# Patient Record
Sex: Female | Born: 1985 | Race: White | Hispanic: No | Marital: Single | Smoking: Never smoker
Health system: Southern US, Community
[De-identification: ages and names within clinical notes are randomized; demographics above are authoritative.]

## PROBLEM LIST (undated history)

## (undated) DIAGNOSIS — E119 Type 2 diabetes mellitus without complications: Secondary | ICD-10-CM

## (undated) DIAGNOSIS — K900 Celiac disease: Secondary | ICD-10-CM

## (undated) DIAGNOSIS — N920 Excessive and frequent menstruation with regular cycle: Secondary | ICD-10-CM

## (undated) DIAGNOSIS — J45909 Unspecified asthma, uncomplicated: Secondary | ICD-10-CM

## (undated) HISTORY — DX: Unspecified asthma, uncomplicated: J45.909

## (undated) HISTORY — DX: Celiac disease: K90.0

## (undated) HISTORY — PX: WISDOM TOOTH EXTRACTION: SHX21

## (undated) HISTORY — DX: Excessive and frequent menstruation with regular cycle: N92.0

---
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
## 2007-04-22 ENCOUNTER — Emergency Department (HOSPITAL_COMMUNITY): Admission: EM | Admit: 2007-04-22 | Discharge: 2007-04-22 | Payer: Self-pay | Admitting: Emergency Medicine

## 2010-11-02 LAB — I-STAT 8, (EC8 V) (CONVERTED LAB)
Acid-Base Excess: 1
Bicarbonate: 24.8 — ABNORMAL HIGH
Glucose, Bld: 93
Potassium: 3.4 — ABNORMAL LOW
TCO2: 26
pCO2, Ven: 38.1 — ABNORMAL LOW
pH, Ven: 7.422 — ABNORMAL HIGH

## 2010-11-02 LAB — POCT I-STAT CREATININE
Creatinine, Ser: 0.8
Operator id: 161631

## 2014-04-19 ENCOUNTER — Encounter (HOSPITAL_COMMUNITY): Payer: Self-pay | Admitting: Emergency Medicine

## 2014-04-19 ENCOUNTER — Emergency Department (HOSPITAL_COMMUNITY)
Admission: EM | Admit: 2014-04-19 | Discharge: 2014-04-19 | Disposition: A | Discharge status: Home / Self Care | Payer: BLUE CROSS/BLUE SHIELD | Source: Home / Self Care | Attending: Emergency Medicine | Admitting: Emergency Medicine

## 2014-04-19 DIAGNOSIS — H6503 Acute serous otitis media, bilateral: Secondary | ICD-10-CM

## 2014-04-19 DIAGNOSIS — J329 Chronic sinusitis, unspecified: Secondary | ICD-10-CM

## 2014-04-19 DIAGNOSIS — R42 Dizziness and giddiness: Secondary | ICD-10-CM

## 2014-04-19 DIAGNOSIS — B9789 Other viral agents as the cause of diseases classified elsewhere: Secondary | ICD-10-CM

## 2014-04-19 DIAGNOSIS — H66003 Acute suppurative otitis media without spontaneous rupture of ear drum, bilateral: Secondary | ICD-10-CM

## 2014-04-19 DIAGNOSIS — B349 Viral infection, unspecified: Secondary | ICD-10-CM

## 2014-04-19 MED ORDER — FLUTICASONE PROPIONATE 50 MCG/ACT NA SUSP: 2.0000 | Freq: Two times a day (BID) | NASAL | Status: DC

## 2014-04-19 MED ORDER — PREDNISONE 10 MG PO TABS: ORAL_TABLET | ORAL | Status: DC

## 2014-04-19 NOTE — ED Provider Notes (Signed)
CSN: [id number]     Arrival date & time 04/19/14  1356 History   First MD Initiated Contact with Patient 04/19/14 1443     Chief Complaint  Patient presents with  . Otalgia   (Consider location/radiation/quality/duration/timing/severity/associated sxs/prior Treatment) HPI          29 year old female presents complaining of possible ear infection. She describes vertigo that has been occurring for 3 days. She saw her doctor who prescribed Antivert which has helped. Today she workup with right sided earache and also sinus pressure associated is concerned about a possible infection. The vertigo is much better today than it has been. No fever, chills, NVD, cough, chest pain, shortness of breath. She does endorse nasal congestion or rhinorrhea.  No past medical history on file. No past surgical history on file. No family history on file. History  Substance Use Topics  . Smoking status: Not on file  . Smokeless tobacco: Not on file  . Alcohol Use: Not on file   OB History    No data available     Review of Systems  Constitutional: Negative for fever and chills.  HENT: Positive for congestion, ear pain, rhinorrhea and sinus pressure. Negative for ear discharge.   Cardiovascular: Negative for chest pain.  Gastrointestinal: Negative for nausea, vomiting and diarrhea.  All other systems reviewed and are negative.   Allergies  Review of patient's allergies indicates not on file.  Home Medications   Prior to Admission medications   Medication Sig Start Date End Date Taking? Authorizing Provider  fluticasone (FLONASE) 50 MCG/ACT nasal spray Place 2 sprays into both nostrils 2 (two) times daily. Decrease to 2 sprays/nostril daily after 5 days 04/19/14   Graylon Good, PA-C  predniSONE (DELTASONE) 10 MG tablet 4 tabs PO QD for 4 days; 3 tabs PO QD for 3 days; 2 tabs PO QD for 2 days; 1 tab PO QD for 1 day 04/19/14   Graylon Good, PA-C   BP 126/91 mmHg  Pulse 86  Temp(Src) 98.7 F (37.1  C) (Oral)  Resp 16  SpO2 96% Physical Exam  Constitutional: She is oriented to person, place, and time. Vital signs are normal. She appears well-developed and well-nourished. No distress.  HENT:  Head: Normocephalic and atraumatic.  Right Ear: External ear normal.  Left Ear: External ear normal.  Nose: Nose normal.  Mouth/Throat: Oropharynx is clear and moist. No oropharyngeal exudate.  A lateral tympanic membranes are swollen with a serous effusion  Eyes: Conjunctivae are normal.  Neck: Normal range of motion. Neck supple.  Cardiovascular: Normal rate, regular rhythm and normal heart sounds.   Pulmonary/Chest: Effort normal and breath sounds normal. No respiratory distress.  Lymphadenopathy:    She has no cervical adenopathy.  Neurological: She is alert and oriented to person, place, and time. She has normal strength. Coordination normal.  Skin: Skin is warm and dry. No rash noted. She is not diaphoretic.  Psychiatric: She has a normal mood and affect. Judgment normal.  Nursing note and vitals reviewed.   ED Course  Procedures (including critical care time) Labs Review Labs Reviewed - No data to display  Imaging Review No results found.   MDM   1. Acute serous otitis media without rupture, bilateral   2. Viral sinusitis   3. Vertigo    No antibiotics indicated, treated with prednisone and Flonase for inflammation, follow-up if any worsening or if no improvement in a week  Meds ordered this encounter  Medications  .  predniSONE (DELTASONE) 10 MG tablet    Sig: 4 tabs PO QD for 4 days; 3 tabs PO QD for 3 days; 2 tabs PO QD for 2 days; 1 tab PO QD for 1 day    Dispense:  30 tablet    Refill:  0  . fluticasone (FLONASE) 50 MCG/ACT nasal spray    Sig: Place 2 sprays into both nostrils 2 (two) times daily. Decrease to 2 sprays/nostril daily after 5 days    Dispense:  16 g    Refill:  2       Graylon GoodZachary H Kaven Cumbie, PA-C 04/19/14 1513

## 2014-04-19 NOTE — ED Notes (Signed)
Pt c/o right ear pain since 04/17/2014. Pt states that she believes she has a right ear infection

## 2014-04-19 NOTE — Discharge Instructions (Signed)
Serous Otitis Media Serous otitis media is fluid in the middle ear space. This space contains the bones for hearing and air. Air in the middle ear space helps to transmit sound.  The air gets there through the eustachian tube. This tube goes from the back of the nose (nasopharynx) to the middle ear space. It keeps the pressure in the middle ear the same as the outside world. It also helps to drain fluid from the middle ear space. CAUSES  Serous otitis media occurs when the eustachian tube gets blocked. Blockage can come from:  Ear infections.  Colds and other upper respiratory infections.  Allergies.  Irritants such as cigarette smoke.  Sudden changes in air pressure (such as descending in an airplane).  Enlarged adenoids.  A mass in the nasopharynx. During colds and upper respiratory infections, the middle ear space can become temporarily filled with fluid. This can happen after an ear infection also. Once the infection clears, the fluid will generally drain out of the ear through the eustachian tube. If it does not, then serous otitis media occurs. SIGNS AND SYMPTOMS   Hearing loss.  A feeling of fullness in the ear, without pain.  Young children may not show any symptoms but may show slight behavioral changes, such as agitation, ear pulling, or crying. DIAGNOSIS  Serous otitis media is diagnosed by an ear exam. Tests may be done to check on the movement of the eardrum. Hearing exams may also be done. TREATMENT  The fluid most often goes away without treatment. If allergy is the cause, allergy treatment may be helpful. Fluid that persists for several months may require minor surgery. A small tube is placed in the eardrum to:  Drain the fluid.  Restore the air in the middle ear space. In certain situations, antibiotic medicines are used to avoid surgery. Surgery may be done to remove enlarged adenoids (if this is the cause). HOME CARE INSTRUCTIONS   Keep children away from  tobacco smoke.  Keep all follow-up visits as directed by your health care provider. SEEK MEDICAL CARE IF:   Your hearing is not better in 3 months.  Your hearing is worse.  You have ear pain.  You have drainage from the ear.  You have dizziness.  You have serous otitis media only in one ear or have any bleeding from your nose (epistaxis).  You notice a lump on your neck. MAKE SURE YOU:  Understand these instructions.   Will watch your condition.   Will get help right away if you are not doing well or get worse.  Document Released: 04/17/2003 Document Revised: 06/11/2013 Document Reviewed: 08/22/2012 [facility] Patient Information 2015 [place], [place]. This information is not intended to replace advice given to you by your health care provider. Make sure you discuss any questions you have with your health care provider.  Sinusitis Sinusitis is redness, soreness, and inflammation of the paranasal sinuses. Paranasal sinuses are air pockets within the bones of your face (beneath the eyes, the middle of the forehead, or above the eyes). In healthy paranasal sinuses, mucus is able to drain out, and air is able to circulate through them by way of your nose. However, when your paranasal sinuses are inflamed, mucus and air can become trapped. This can allow bacteria and other germs to grow and cause infection. Sinusitis can develop quickly and last only a short time (acute) or continue over a long period (chronic). Sinusitis that lasts for more than 12 weeks is considered chronic.  CAUSES  Causes of sinusitis include:  Allergies.  Structural abnormalities, such as displacement of the cartilage that separates your nostrils (deviated septum), which can decrease the air flow through your nose and sinuses and affect sinus drainage.  Functional abnormalities, such as when the small hairs (cilia) that line your sinuses and help remove mucus do not work properly or are not present. SIGNS AND  SYMPTOMS  Symptoms of acute and chronic sinusitis are the same. The primary symptoms are pain and pressure around the affected sinuses. Other symptoms include:  Upper toothache.  Earache.  Headache.  Bad breath.  Decreased sense of smell and taste.  A cough, which worsens when you are lying flat.  Fatigue.  Fever.  Thick drainage from your nose, which often is green and may contain pus (purulent).  Swelling and warmth over the affected sinuses. DIAGNOSIS  Your health care provider will perform a physical exam. During the exam, your health care provider may:  Look in your nose for signs of abnormal growths in your nostrils (nasal polyps).  Tap over the affected sinus to check for signs of infection.  View the inside of your sinuses (endoscopy) using an imaging device that has a light attached (endoscope). If your health care provider suspects that you have chronic sinusitis, one or more of the following tests may be recommended:  Allergy tests.  Nasal culture. A sample of mucus is taken from your nose, sent to a lab, and screened for bacteria.  Nasal cytology. A sample of mucus is taken from your nose and examined by your health care provider to determine if your sinusitis is related to an allergy. TREATMENT  Most cases of acute sinusitis are related to a viral infection and will resolve on their own within 10 days. Sometimes medicines are prescribed to help relieve symptoms (pain medicine, decongestants, nasal steroid sprays, or saline sprays).  However, for sinusitis related to a bacterial infection, your health care provider will prescribe antibiotic medicines. These are medicines that will help kill the bacteria causing the infection.  Rarely, sinusitis is caused by a fungal infection. In theses cases, your health care provider will prescribe antifungal medicine. For some cases of chronic sinusitis, surgery is needed. Generally, these are cases in which sinusitis recurs  more than 3 times per year, despite other treatments. HOME CARE INSTRUCTIONS   Drink plenty of water. Water helps thin the mucus so your sinuses can drain more easily.  Use a humidifier.  Inhale steam 3 to 4 times a day (for example, sit in the bathroom with the shower running).  Apply a warm, moist washcloth to your face 3 to 4 times a day, or as directed by your health care provider.  Use saline nasal sprays to help moisten and clean your sinuses.  Take medicines only as directed by your health care provider.  If you were prescribed either an antibiotic or antifungal medicine, finish it all even if you start to feel better. SEEK IMMEDIATE MEDICAL CARE IF:  You have increasing pain or severe headaches.  You have nausea, vomiting, or drowsiness.  You have swelling around your face.  You have vision problems.  You have a stiff neck.  You have difficulty breathing. MAKE SURE YOU:   Understand these instructions.  Will watch your condition.  Will get help right away if you are not doing well or get worse. Document Released: 01/25/2005 Document Revised: 06/11/2013 Document Reviewed: 02/09/2011 [facility] Patient Information 2015 [place], [place]. This information is not intended  to replace advice given to you by your health care provider. Make sure you discuss any questions you have with your health care provider.  Upper Respiratory Infection, Adult An upper respiratory infection (URI) is also sometimes known as the common cold. The upper respiratory tract includes the nose, sinuses, throat, trachea, and bronchi. Bronchi are the airways leading to the lungs. Most people improve within 1 week, but symptoms can last up to 2 weeks. A residual cough may last even longer.  CAUSES Many different viruses can infect the tissues lining the upper respiratory tract. The tissues become irritated and inflamed and often become very moist. Mucus production is also common. A cold is contagious.  You can easily spread the virus to others by oral contact. This includes kissing, sharing a glass, coughing, or sneezing. Touching your mouth or nose and then touching a surface, which is then touched by another person, can also spread the virus. SYMPTOMS  Symptoms typically develop 1 to 3 days after you come in contact with a cold virus. Symptoms vary from person to person. They may include:  Runny nose.  Sneezing.  Nasal congestion.  Sinus irritation.  Sore throat.  Loss of voice (laryngitis).  Cough.  Fatigue.  Muscle aches.  Loss of appetite.  Headache.  Low-grade fever. DIAGNOSIS  You might diagnose your own cold based on familiar symptoms, since most people get a cold 2 to 3 times a year. Your caregiver can confirm this based on your exam. Most importantly, your caregiver can check that your symptoms are not due to another disease such as strep throat, sinusitis, pneumonia, asthma, or epiglottitis. Blood tests, throat tests, and X-rays are not necessary to diagnose a common cold, but they may sometimes be helpful in excluding other more serious diseases. Your caregiver will decide if any further tests are required. RISKS AND COMPLICATIONS  You may be at risk for a more severe case of the common cold if you smoke cigarettes, have chronic heart disease (such as heart failure) or lung disease (such as asthma), or if you have a weakened immune system. The very young and very old are also at risk for more serious infections. Bacterial sinusitis, middle ear infections, and bacterial pneumonia can complicate the common cold. The common cold can worsen asthma and chronic obstructive pulmonary disease (COPD). Sometimes, these complications can require emergency medical care and may be life-threatening. PREVENTION  The best way to protect against getting a cold is to practice good hygiene. Avoid oral or hand contact with people with cold symptoms. Wash your hands often if contact occurs.  There is no clear evidence that vitamin C, vitamin E, echinacea, or exercise reduces the chance of developing a cold. However, it is always recommended to get plenty of rest and practice good nutrition. TREATMENT  Treatment is directed at relieving symptoms. There is no cure. Antibiotics are not effective, because the infection is caused by a virus, not by bacteria. Treatment may include:  Increased fluid intake. Sports drinks offer valuable electrolytes, sugars, and fluids.  Breathing heated mist or steam (vaporizer or shower).  Eating chicken soup or other clear broths, and maintaining good nutrition.  Getting plenty of rest.  Using gargles or lozenges for comfort.  Controlling fevers with ibuprofen or acetaminophen as directed by your caregiver.  Increasing usage of your inhaler if you have asthma. Zinc gel and zinc lozenges, taken in the first 24 hours of the common cold, can shorten the duration and lessen the severity of symptoms.  Pain medicines may help with fever, muscle aches, and throat pain. A variety of non-prescription medicines are available to treat congestion and runny nose. Your caregiver can make recommendations and may suggest nasal or lung inhalers for other symptoms.  HOME CARE INSTRUCTIONS   Only take over-the-counter or prescription medicines for pain, discomfort, or fever as directed by your caregiver.  Use a warm mist humidifier or inhale steam from a shower to increase air moisture. This may keep secretions moist and make it easier to breathe.  Drink enough water and fluids to keep your urine clear or pale yellow.  Rest as needed.  Return to work when your temperature has returned to normal or as your caregiver advises. You may need to stay home longer to avoid infecting others. You can also use a face mask and careful hand washing to prevent spread of the virus. SEEK MEDICAL CARE IF:   After the first few days, you feel you are getting worse rather than  better.  You need your caregiver's advice about medicines to control symptoms.  You develop chills, worsening shortness of breath, or brown or red sputum. These may be signs of pneumonia.  You develop yellow or brown nasal discharge or pain in the face, especially when you bend forward. These may be signs of sinusitis.  You develop a fever, swollen neck glands, pain with swallowing, or white areas in the back of your throat. These may be signs of strep throat. SEEK IMMEDIATE MEDICAL CARE IF:   You have a fever.  You develop severe or persistent headache, ear pain, sinus pain, or chest pain.  You develop wheezing, a prolonged cough, cough up blood, or have a change in your usual mucus (if you have chronic lung disease).  You develop sore muscles or a stiff neck. Document Released: 07/21/2000 Document Revised: 04/19/2011 Document Reviewed: 05/02/2013 [facility] Patient Information 2015 [place], [place]. This information is not intended to replace advice given to you by your health care provider. Make sure you discuss any questions you have with your health care provider.  Antibiotic Resistance Antibiotics are drugs. They fight infections caused by bacteria. Antibiotics greatly reduce illness and death from infectious diseases. Over time, the bacteria that antibiotics once controlled are much harder to kill. CAUSES  Antibiotic resistance occurs when bacteria change in some way. These changes can lessen the abilities of drugs designed to cure infections. The overuse of antibiotics can cause antibiotic resistance. Almost all important bacterial infections in the world are becoming resistant to drugs. Antibiotic resistance has been called one of the world's most pressing public health problems.  Antibiotics should be used to treat bacterial infections. But they are not effective against viral infections. These include the common cold, most sore throats, and the flu. Smart use of antibiotics will  control the spread of resistance.  TREATMENT   Only use antibiotics as prescribed by your caregiver.  Talk with your caregiver about antibiotic resistance.  Ask what else you can do to feel better.  Do not take an antibiotic for a viral infection. This could be a cold, cough, or the flu.  Do not save some of your antibiotic for the next time you get sick.  Take an antibiotic exactly as the caregiver tells you.  Do not take an antibiotic that is prescribed for someone else.  Use the antibiotic as directed. Take the correct dose at the scheduled time. SEEK MEDICAL CARE IF:  You react to the antibiotic with:  A rash.  Itching.  An upset stomach. Document Released: 04/17/2002 Document Revised: 06/11/2013 Document Reviewed: 11/20/2007 [facility] Patient Information 2015 [place], [place]. This information is not intended to replace advice given to you by your health care provider. Make sure you discuss any questions you have with your health care provider.  Vertigo Vertigo means you feel like you or your surroundings are moving when they are not. Vertigo can be dangerous if it occurs when you are at work, driving, or performing difficult activities.  CAUSES  Vertigo occurs when there is a conflict of signals sent to your brain from the visual and sensory systems in your body. There are many different causes of vertigo, including:  Infections, especially in the inner ear.  A bad reaction to a drug or misuse of alcohol and medicines.  Withdrawal from drugs or alcohol.  Rapidly changing positions, such as lying down or rolling over in bed.  A migraine headache.  Decreased blood flow to the brain.  Increased pressure in the brain from a head injury, infection, tumor, or bleeding. SYMPTOMS  You may feel as though the world is spinning around or you are falling to the ground. Because your balance is upset, vertigo can cause nausea and vomiting. You may have involuntary eye movements  (nystagmus). DIAGNOSIS  Vertigo is usually diagnosed by physical exam. If the cause of your vertigo is unknown, your caregiver may perform imaging tests, such as an MRI scan (magnetic resonance imaging). TREATMENT  Most cases of vertigo resolve on their own, without treatment. Depending on the cause, your caregiver may prescribe certain medicines. If your vertigo is related to body position issues, your caregiver may recommend movements or procedures to correct the problem. In rare cases, if your vertigo is caused by certain inner ear problems, you may need surgery. HOME CARE INSTRUCTIONS   Follow your caregiver's instructions.  Avoid driving.  Avoid operating heavy machinery.  Avoid performing any tasks that would be dangerous to you or others during a vertigo episode.  Tell your caregiver if you notice that certain medicines seem to be causing your vertigo. Some of the medicines used to treat vertigo episodes can actually make them worse in some people. SEEK IMMEDIATE MEDICAL CARE IF:   Your medicines do not relieve your vertigo or are making it worse.  You develop problems with talking, walking, weakness, or using your arms, hands, or legs.  You develop severe headaches.  Your nausea or vomiting continues or gets worse.  You develop visual changes.  A family member notices behavioral changes.  Your condition gets worse. MAKE SURE YOU:  Understand these instructions.  Will watch your condition.  Will get help right away if you are not doing well or get worse. Document Released: 11/04/2004 Document Revised: 04/19/2011 Document Reviewed: 08/13/2010 [facility] Patient Information 2015 [place], [place]. This information is not intended to replace advice given to you by your health care provider. Make sure you discuss any questions you have with your health care provider.

## 2015-12-28 NOTE — Progress Notes (Signed)
Subjective:    Patient ID: Kristi Jacobson, female    DOB: [birth year], 30 y.o.   MRN: [id number]  HPI pt is referred by Dr Seymour BarsLavoie for diabetes.  Pt was noted to have high glucose last week; she has mild if any neuropathy of the lower extremities; she is unaware of any associated chronic complications; she has never been on rx for DM; pt says her diet and exercise are fair; she has never had GDM (G0), pancreatitis, severe hypoglycemia or DKA.  She says she is not at risk for pregnancy.   Past Medical History:  Diagnosis Date  . Celiac disease     No past surgical history on file.  Social History   Social History  . Marital status: Single    Spouse name: N/A  . Number of children: N/A  . Years of education: N/A   Occupational History  . Not on file.   Social History Main Topics  . Smoking status: Never Smoker  . Smokeless tobacco: Not on file  . Alcohol use No  . Drug use: Unknown  . Sexual activity: Not on file   Other Topics Concern  . Not on file   Social History Narrative  . No narrative on file    No current outpatient prescriptions on file prior to visit.   No current facility-administered medications on file prior to visit.     Allergies  Allergen Reactions  . Cephalosporins   . Penicillins     Family History  Problem Relation Age of Onset  . Diabetes Brother     BP (!) 136/94   Pulse (!) 102   Ht 5' 5.75" (1.67 m)   Wt 218 lb (98.9 kg)   SpO2 97%   BMI 35.45 kg/m    Review of Systems denies blurry vision, chest pain, sob, n/v, urinary frequency, muscle cramps, excessive diaphoresis, cold intolerance, and easy bruising.  She has intermittent headache.  She has weight gain, rhinorrhea, and mild depression.     Objective:   Physical Exam VS: see vs page GEN: no distress HEAD: head: no deformity eyes: no periorbital swelling, no proptosis external nose and ears are normal mouth: no lesion seen NECK: supple, thyroid is not  enlarged CHEST WALL: no deformity LUNGS: clear to auscultation CV: reg rate and rhythm, no murmur ABD: abdomen is soft, nontender.  no hepatosplenomegaly.  not distended.  no hernia MUSCULOSKELETAL: muscle bulk and strength are grossly normal.  no obvious joint swelling.  gait is normal and steady EXTEMITIES: no deformity.  no ulcer on the feet.  feet are of normal color and temp.  no edema.  Left great toe is bandaged (sees podiatry for ingrown nail).   PULSES: dorsalis pedis intact bilat.  no carotid bruit NEURO:  cn 2-12 grossly intact.   readily moves all 4's.  sensation is intact to touch on the feet SKIN:  Normal texture and temperature.  No rash or suspicious lesion is visible.   NODES:  None palpable at the neck PSYCH: alert, well-oriented.  Does not appear anxious nor depressed.  outside test results are reviewed: A1c=7.3%  I have reviewed outside records, and summarized: Pt was noted to have elevated a1c, and referred here.  She was seen for vaginal bleeding.  She reported weight gain.      Assessment & Plan:  Type 2 DM: new Obesity: I advised surgery, but she declines.  HTN, prob situational.  Patient is advised the following: Patient Instructions  good diet  and exercise significantly improve the control of your diabetes.  please let me know if you wish to be referred to a dietician.  high blood sugar is very risky to your health.  you should see an eye doctor and dentist every year.  It is very important to get all recommended vaccinations.  Controlling your blood pressure and cholesterol drastically reduces the damage diabetes does to your body.  Those who smoke should quit.  Please discuss these with your doctor.  check your blood sugar once a day.  vary the time of day when you check, between before the 3 meals, and at bedtime.  also check if you have symptoms of your blood sugar being too high or too low.  please keep a record of the readings and bring it to your next  appointment here (or you can bring the meter itself).  You can write it on any piece of paper.  please call us sooner if your blood sugar goes below 70, or if you have a lot of readings over 200.  Here is a new meter.  I have sent a prescription to your pharmacy, for strips.   I have sent a prescription to your pharmacy, for a diabetes medication.  Please come back for a follow-up appointment in 3 months.

## 2015-12-29 ENCOUNTER — Ambulatory Visit (INDEPENDENT_AMBULATORY_CARE_PROVIDER_SITE_OTHER): Payer: BLUE CROSS/BLUE SHIELD | Admitting: Endocrinology

## 2015-12-29 ENCOUNTER — Encounter: Payer: Self-pay | Admitting: Endocrinology

## 2015-12-29 DIAGNOSIS — K900 Celiac disease: Secondary | ICD-10-CM

## 2015-12-29 DIAGNOSIS — J45909 Unspecified asthma, uncomplicated: Secondary | ICD-10-CM

## 2015-12-29 DIAGNOSIS — N924 Excessive bleeding in the premenopausal period: Secondary | ICD-10-CM | POA: Diagnosis not present

## 2015-12-29 MED ORDER — GLUCOSE BLOOD VI STRP: 1.0000 | ORAL_STRIP | Freq: Every day | 3 refills | Status: DC

## 2015-12-29 MED ORDER — METFORMIN HCL ER 500 MG PO TB24
500.0000 mg | ORAL_TABLET | Freq: Every day | ORAL | 3 refills | Status: DC
Start: 2015-12-29 — End: 2016-12-06

## 2015-12-29 NOTE — Patient Instructions (Addendum)
good diet and exercise significantly improve the control of your diabetes.  please let me know if you wish to be referred to a dietician.  high blood sugar is very risky to your health.  you should see an eye doctor and dentist every year.  It is very important to get all recommended vaccinations.  Controlling your blood pressure and cholesterol drastically reduces the damage diabetes does to your body.  Those who smoke should quit.  Please discuss these with your doctor.  check your blood sugar once a day.  vary the time of day when you check, between before the 3 meals, and at bedtime.  also check if you have symptoms of your blood sugar being too high or too low.  please keep a record of the readings and bring it to your next appointment here (or you can bring the meter itself).  You can write it on any piece of paper.  please call us sooner if your blood sugar goes below 70, or if you have a lot of readings over 200.  Here is a new meter.  I have sent a prescription to your pharmacy, for strips.   I have sent a prescription to your pharmacy, for a diabetes medication.  Please come back for a follow-up appointment in 3 months.

## 2015-12-30 ENCOUNTER — Telehealth: Payer: Self-pay | Admitting: Endocrinology

## 2015-12-30 ENCOUNTER — Encounter: Payer: Self-pay | Admitting: Endocrinology

## 2015-12-30 DIAGNOSIS — N920 Excessive and frequent menstruation with regular cycle: Secondary | ICD-10-CM | POA: Insufficient documentation

## 2015-12-30 DIAGNOSIS — J45909 Unspecified asthma, uncomplicated: Secondary | ICD-10-CM | POA: Insufficient documentation

## 2015-12-30 MED ORDER — ONETOUCH VERIO IQ SYSTEM W/DEVICE KIT
PACK | 2 refills | Status: DC
Start: 2015-12-30 — End: 2017-02-14

## 2015-12-30 NOTE — Telephone Encounter (Signed)
Refill submitted per patient's request.  

## 2015-12-30 NOTE — Telephone Encounter (Signed)
Pharmacy called and said that Pt insurance will cover the One Touch meter if we could send over a script for that.

## 2016-09-29 ENCOUNTER — Encounter (HOSPITAL_COMMUNITY): Payer: Self-pay | Admitting: Emergency Medicine

## 2016-09-29 DIAGNOSIS — R1031 Right lower quadrant pain: Secondary | ICD-10-CM | POA: Diagnosis present

## 2016-09-29 DIAGNOSIS — Z880 Allergy status to penicillin: Secondary | ICD-10-CM | POA: Diagnosis not present

## 2016-09-29 DIAGNOSIS — R509 Fever, unspecified: Secondary | ICD-10-CM | POA: Insufficient documentation

## 2016-09-29 DIAGNOSIS — J45909 Unspecified asthma, uncomplicated: Secondary | ICD-10-CM | POA: Diagnosis not present

## 2016-09-29 DIAGNOSIS — Z79899 Other long term (current) drug therapy: Secondary | ICD-10-CM | POA: Diagnosis not present

## 2016-09-29 DIAGNOSIS — N83201 Unspecified ovarian cyst, right side: Secondary | ICD-10-CM | POA: Diagnosis not present

## 2016-09-29 DIAGNOSIS — Z7984 Long term (current) use of oral hypoglycemic drugs: Secondary | ICD-10-CM | POA: Diagnosis not present

## 2016-09-29 NOTE — ED Triage Notes (Signed)
Patient arrives with complaint of diffuse abdominal pain starting on Monday. States the pain was very bad that day. That evening she noted a fever up to 102. The symptoms resolved and then returned on both Tues evening and tonight. Patient concerned she has appendicitis. Also notable history of celiac disease.

## 2016-09-30 ENCOUNTER — Emergency Department (HOSPITAL_COMMUNITY): Payer: BLUE CROSS/BLUE SHIELD

## 2016-09-30 ENCOUNTER — Emergency Department (HOSPITAL_COMMUNITY)
Admission: EM | Admit: 2016-09-30 | Discharge: 2016-09-30 | Disposition: A | Discharge status: Home / Self Care | Payer: BLUE CROSS/BLUE SHIELD | Attending: Emergency Medicine | Admitting: Emergency Medicine

## 2016-09-30 DIAGNOSIS — N83201 Unspecified ovarian cyst, right side: Secondary | ICD-10-CM

## 2016-09-30 LAB — CBC
HEMATOCRIT: 37.9 % (ref 36.0–46.0)
Hemoglobin: 12.2 g/dL (ref 12.0–15.0)
MCH: 24.1 pg — AB (ref 26.0–34.0)
MCHC: 32.2 g/dL (ref 30.0–36.0)
MCV: 74.9 fL — AB (ref 78.0–100.0)
PLATELETS: 220 10*3/uL (ref 150–400)
RBC: 5.06 MIL/uL (ref 3.87–5.11)
RDW: 14.6 % (ref 11.5–15.5)
WBC: 9.5 10*3/uL (ref 4.0–10.5)

## 2016-09-30 LAB — COMPREHENSIVE METABOLIC PANEL
ALT: 29 U/L (ref 14–54)
AST: 26 U/L (ref 15–41)
Albumin: 4 g/dL (ref 3.5–5.0)
Alkaline Phosphatase: 60 U/L (ref 38–126)
Anion gap: 8 (ref 5–15)
BUN: 6 mg/dL (ref 6–20)
CHLORIDE: 105 mmol/L (ref 101–111)
CO2: 22 mmol/L (ref 22–32)
CREATININE: 0.57 mg/dL (ref 0.44–1.00)
Calcium: 9.4 mg/dL (ref 8.9–10.3)
GFR calc Af Amer: >60 mL/min (ref >60)
GFR calc non Af Amer: >60 mL/min (ref >60)
Glucose, Bld: 136 mg/dL — ABNORMAL HIGH (ref 65–99)
Potassium: 3.6 mmol/L (ref 3.5–5.1)
SODIUM: 135 mmol/L (ref 135–145)
Total Bilirubin: 0.4 mg/dL (ref 0.3–1.2)
Total Protein: 7.1 g/dL (ref 6.5–8.1)

## 2016-09-30 LAB — URINALYSIS, ROUTINE W REFLEX MICROSCOPIC
Bilirubin Urine: NEGATIVE
GLUCOSE, UA: NEGATIVE mg/dL
HGB URINE DIPSTICK: NEGATIVE
Ketones, ur: NEGATIVE mg/dL
Leukocytes, UA: NEGATIVE
Nitrite: NEGATIVE
PH: 6 (ref 5.0–8.0)
PROTEIN: NEGATIVE mg/dL
Specific Gravity, Urine: 1.008 (ref 1.005–1.030)

## 2016-09-30 LAB — WET PREP, GENITAL
CLUE CELLS WET PREP: NONE SEEN
SPERM: NONE SEEN
Trich, Wet Prep: NONE SEEN
YEAST WET PREP: NONE SEEN

## 2016-09-30 LAB — I-STAT CG4 LACTIC ACID, ED: Lactic Acid, Venous: 1.21 mmol/L (ref 0.5–1.9)

## 2016-09-30 LAB — I-STAT BETA HCG BLOOD, ED (MC, WL, AP ONLY): I-stat hCG, quantitative: <5 m[IU]/mL (ref <5)

## 2016-09-30 LAB — LIPASE, BLOOD: LIPASE: 32 U/L (ref 11–51)

## 2016-09-30 MED ORDER — IBUPROFEN 800 MG PO TABS
800.0000 mg | ORAL_TABLET | Freq: Once | ORAL | Status: AC
  Administered 2016-09-30: 800 mg via ORAL
  Filled 2016-09-30: qty 1

## 2016-09-30 MED ORDER — TRAMADOL HCL 50 MG PO TABS
50.0000 mg | ORAL_TABLET | Freq: Four times a day (QID) | ORAL | 0 refills | Status: DC | PRN
Start: 2016-09-30 — End: 2016-10-07

## 2016-09-30 MED ORDER — ONDANSETRON HCL 4 MG/2ML IJ SOLN
4.0000 mg | Freq: Once | INTRAMUSCULAR | Status: AC
  Administered 2016-09-30: 4 mg via INTRAVENOUS
  Filled 2016-09-30: qty 2

## 2016-09-30 MED ORDER — IOPAMIDOL (ISOVUE-300) INJECTION 61%
INTRAVENOUS | Status: AC
  Administered 2016-09-30: 100 mL
  Filled 2016-09-30: qty 100

## 2016-09-30 MED ORDER — ACETAMINOPHEN 500 MG PO TABS
1000.0000 mg | ORAL_TABLET | Freq: Once | ORAL | Status: AC
  Administered 2016-09-30: 1000 mg via ORAL
  Filled 2016-09-30: qty 2

## 2016-09-30 MED ORDER — IBUPROFEN 800 MG PO TABS: 800.0000 mg | ORAL_TABLET | Freq: Three times a day (TID) | ORAL | 0 refills | Status: DC

## 2016-09-30 MED ORDER — SODIUM CHLORIDE 0.9 % IV BOLUS (SEPSIS)
1000.0000 mL | Freq: Once | INTRAVENOUS | Status: AC
  Administered 2016-09-30: 1000 mL via INTRAVENOUS

## 2016-09-30 NOTE — ED Provider Notes (Signed)
[facility] DEPT Provider Note   CSN: [id number] Arrival date & time: 09/29/16  2318     History   Chief Complaint Chief Complaint  Patient presents with  . Abdominal Pain    HPI Kristi Jacobson is a 31 y.o. female.  Patient presents to the ER for evaluation of abdominal pain and fever. Symptoms began 2 nights ago. She reports severe predominantly right-sided pain 2 nights ago for the fever that then resolved by the morning. Fever at that time was 102. She did well during the day yesterday and then started to have a fever and abdominal pain again last night. This pattern continued today, she has become concerned that she might have appendicitis. She has not had nausea, vomiting or diarrhea.      Past Medical History:  Diagnosis Date  . Asthma   . Celiac disease   . Menorrhagia     Patient Active Problem List   Diagnosis Date Noted  . Asthma   . Menorrhagia   . Celiac disease     History reviewed. No pertinent surgical history.  OB History    No data available       Home Medications    Prior to Admission medications   Medication Sig Start Date End Date Taking? Authorizing Provider  acetaminophen (TYLENOL) 325 MG tablet Take 650 mg by mouth every 6 (six) hours as needed for fever.   Yes [provider]  chlorpheniramine (CHLOR-TRIMETON) 4 MG tablet Take 4 mg by mouth 2 (two) times daily as needed for allergies.   Yes [provider]  metFORMIN (GLUCOPHAGE-XR) 500 MG 24 hr tablet Take 1 tablet (500 mg total) by mouth daily with breakfast. 12/29/15  Yes Renato Shin, MD  simethicone (MYLICON) 80 MG chewable tablet Chew 80 mg by mouth every 6 (six) hours as needed for flatulence.   Yes [provider]  Blood Glucose Monitoring Suppl (ONETOUCH VERIO IQ SYSTEM) w/Device KIT Use to check blood sugar 1 time per day. 12/30/15   Renato Shin, MD  glucose blood test strip 1 each by Other route daily. And lancets 1/day 12/29/15   Renato Shin, MD  ibuprofen (ADVIL,MOTRIN) 800 MG tablet Take 1 tablet (800 mg total) by mouth 3 (three) times daily. 09/30/16   Orpah Greek, MD  traMADol (ULTRAM) 50 MG tablet Take 1 tablet (50 mg total) by mouth every 6 (six) hours as needed. 09/30/16   Orpah Greek, MD    Family History Family History  Problem Relation Age of Onset  . Diabetes Brother     Social History Social History  Substance Use Topics  . Smoking status: Never Smoker  . Smokeless tobacco: Never Used  . Alcohol use No     Allergies   Cephalosporins and Penicillins   Review of Systems Review of Systems  Constitutional: Positive for fever.  Gastrointestinal: Positive for abdominal pain.  All other systems reviewed and are negative.    Physical Exam Updated Vital Signs BP 123/68   Pulse (!) 117   Temp (!) 101.7 F (38.7 C) (Oral)   Resp 18   LMP 09/05/2016 (Exact Date)   SpO2 99%   Physical Exam  Constitutional: She is oriented to person, place, and time. She appears well-developed and well-nourished. No distress.  HENT:  Head: Normocephalic and atraumatic.  Right Ear: Hearing normal.  Left Ear: Hearing normal.  Nose: Nose normal.  Mouth/Throat: Oropharynx is clear and moist and mucous membranes are normal.  Eyes: Pupils are  equal, round, and reactive to light. Conjunctivae and EOM are normal.  Neck: Normal range of motion. Neck supple.  Cardiovascular: Regular rhythm, S1 normal and S2 normal.  Exam reveals no gallop and no friction rub.   No murmur heard. Pulmonary/Chest: Effort normal and breath sounds normal. No respiratory distress. She exhibits no tenderness.  Abdominal: Soft. Normal appearance and bowel sounds are normal. There is no hepatosplenomegaly. There is tenderness in the right upper quadrant and right lower quadrant. There is no rebound, no guarding, no tenderness at McBurney's point and negative Murphy's sign. No hernia.  Musculoskeletal: Normal range of motion.    Neurological: She is alert and oriented to person, place, and time. She has normal strength. No cranial nerve deficit or sensory deficit. Coordination normal. GCS eye subscore is 4. GCS verbal subscore is 5. GCS motor subscore is 6.  Skin: Skin is warm, dry and intact. No rash noted. No cyanosis.  Psychiatric: She has a normal mood and affect. Her speech is normal and behavior is normal. Thought content normal.  Nursing note and vitals reviewed.    ED Treatments / Results  Labs (all labs ordered are listed, but only abnormal results are displayed) Labs Reviewed  WET PREP, GENITAL - Abnormal; Notable for the following:       Result Value   WBC, Wet Prep HPF POC MODERATE (*)    All other components within normal limits  COMPREHENSIVE METABOLIC PANEL - Abnormal; Notable for the following:    Glucose, Bld 136 (*)    All other components within normal limits  CBC - Abnormal; Notable for the following:    MCV 74.9 (*)    MCH 24.1 (*)    All other components within normal limits  URINALYSIS, ROUTINE W REFLEX MICROSCOPIC - Abnormal; Notable for the following:    Color, Urine STRAW (*)    All other components within normal limits  LIPASE, BLOOD  I-STAT BETA HCG BLOOD, ED ([facility], [facility], [facility] ONLY)  I-STAT CG4 LACTIC ACID, ED    EKG  EKG Interpretation None       Radiology Ct Abdomen Pelvis W Contrast  Result Date: 09/30/2016 CLINICAL DATA:  Diffuse abdominal pain for 2 days. EXAM: CT ABDOMEN AND PELVIS WITH CONTRAST TECHNIQUE: Multidetector CT imaging of the abdomen and pelvis was performed using the standard protocol following bolus administration of intravenous contrast. CONTRAST:  120m ISOVUE-300 IOPAMIDOL (ISOVUE-300) INJECTION 61% COMPARISON:  None. FINDINGS: Lower chest: The lung bases are clear. Hepatobiliary: Focal fatty infiltration adjacent with falciform ligament. No suspicious lesion. Gallbladder physiologically distended, no calcified stone. No biliary dilatation. Pancreas: No  ductal dilatation or inflammation. Spleen: Upper normal in size spanning 13.3 cm.  Splenule inferiorly. Adrenals/Urinary Tract: Normal adrenal glands. No hydronephrosis or perinephric edema. Homogeneous renal enhancement. Urinary bladder is distended without wall thickening. Stomach/Bowel: Stomach is within normal limits. Appendix appears normal. No evidence of bowel wall thickening, distention, or inflammatory changes. Sigmoid colonic tortuosity. Vascular/Lymphatic: No significant vascular findings are present. No enlarged abdominal or pelvic lymph nodes. Reproductive: Normal uterus and left ovary. 3.5 cm cyst in the right ovary. Other: No free air, free fluid, or intra-abdominal fluid collection. Musculoskeletal: There are no acute or suspicious osseous abnormalities. IMPRESSION: 1. Right ovarian cyst measuring 3.5 cm, no dedicated further imaging follow-up is needed. This recommendation follows ACR consensus guidelines: White Paper of the ACR Incidental Findings Committee II on Adnexal Findings. J Am Coll Radiol 2013:10:675-681. 2. Otherwise no acute abnormality in the abdomen/pelvis. Electronically Signed  By: Jeb Levering M.D.   On: 09/30/2016 03:34    Procedures Procedures (including critical care time)  Medications Ordered in ED Medications  acetaminophen (TYLENOL) tablet 1,000 mg (not administered)  ibuprofen (ADVIL,MOTRIN) tablet 800 mg (not administered)  sodium chloride 0.9 % bolus 1,000 mL (0 mLs Intravenous Stopped 09/30/16 0330)    Followed by  sodium chloride 0.9 % bolus 1,000 mL (0 mLs Intravenous Stopped 09/30/16 0330)  ondansetron (ZOFRAN) injection 4 mg (4 mg Intravenous Given 09/30/16 0222)  iopamidol (ISOVUE-300) 61 % injection (100 mLs  Contrast Given 09/30/16 0308)     Initial Impression / Assessment and Plan / ED Course  I have reviewed the triage vital signs and the nursing notes.  Pertinent labs & imaging results that were available during my care of the patient were  reviewed by me and considered in my medical decision making (see chart for details).     Patient presents to the emergency department for evaluation of right-sided abdominal pain. Symptoms began 3 days ago. She reports that symptoms are worse at night. She has had concomitant fever associatedwith the pain. Patient has become concerned that she might have appendicitis. Examination did reveal diffuse right-sided tenderness without guarding or rebound.  Lab work is unremarkable. She has a normal lactic acid. Pregnancy was negative. Culprits metabolic panel and lipase are normal. CBC is normal including a white blood cell count 9.5.urinalysis is entirely normal. CT abdomen and pelvis performed to further evaluate for abdominal pain and fever. CT scan is normal except for right ovarian cyst.  I did perform a pelvic exam. This was a difficult and limited exam - patient is not sexually active and did not tolerate the speculum. Based on her lack of sexual activity, do not suspect tubo-ovarian abscess or STD as a cause of her fever. Fever of unclear etiology, possibly viral in nature. Treat with analgesia for pain possibly from ovarian cyst, follow-up with her OB/GYN doctor.  Final Clinical Impressions(s) / ED Diagnoses   Final diagnoses:  Cyst of right ovary    New Prescriptions New Prescriptions   IBUPROFEN (ADVIL,MOTRIN) 800 MG TABLET    Take 1 tablet (800 mg total) by mouth 3 (three) times daily.   TRAMADOL (ULTRAM) 50 MG TABLET    Take 1 tablet (50 mg total) by mouth every 6 (six) hours as needed.     Orpah Greek, MD 09/30/16 [phone]

## 2016-09-30 NOTE — ED Notes (Signed)
Attempted PIV x 2. No success. 2nd RN to attempt.  

## 2016-10-07 ENCOUNTER — Encounter: Payer: Self-pay | Admitting: Obstetrics & Gynecology

## 2016-10-07 ENCOUNTER — Ambulatory Visit (INDEPENDENT_AMBULATORY_CARE_PROVIDER_SITE_OTHER): Payer: BLUE CROSS/BLUE SHIELD | Admitting: Obstetrics & Gynecology

## 2016-10-07 VITALS — BP 122/84

## 2016-10-07 DIAGNOSIS — E119 Type 2 diabetes mellitus without complications: Secondary | ICD-10-CM

## 2016-10-07 DIAGNOSIS — L700 Acne vulgaris: Secondary | ICD-10-CM

## 2016-10-07 DIAGNOSIS — N83201 Unspecified ovarian cyst, right side: Secondary | ICD-10-CM | POA: Diagnosis not present

## 2016-10-07 MED ORDER — DROSPIRENONE-ETHINYL ESTRADIOL 3-0.02 MG PO TABS: 1.0000 | ORAL_TABLET | Freq: Every day | ORAL | 4 refills | Status: DC

## 2016-10-07 NOTE — Progress Notes (Signed)
    Kristi Jacobson May 28, 1985 [id number]        31 y.o.  G0 Single.  Currently abstinent.  RP:  RLQ pain/Ovarian Cyst  HPI:  ER visit on 09/30/2016 for severe RLQ pain.  CT scan done on 09/30/2016 showed a Rt simple 3.5 cm Ovarian cyst, no FF.  Severe RLQ pain resolved.  Started her period 10/06/2016.  Mild dysmenorrhea as usual, which is a crampy midline pain/pressure.  Menstrual flow normal.  Has acne.  Periods have been more regular q21-28 days in the past year as she lost 30 Lbs on a low Calorie/low Carb diet.  DM type 2 on Metformin.  Past medical history,surgical history, problem list, medications, allergies, family history and social history were all reviewed and documented in the EPIC chart. [facility]  Directed ROS with pertinent positives and negatives documented in the history of present illness/assessment and plan.  Exam:  Vitals:   10/07/16 0946  BP: 122/84   General appearance:  Normal  Moderate facial acne  Abdo:  Soft, NT, no mass felt  Gyn exam:  Vulva normal                     Bimanual exam:  Uterus AV, normal volume, mobile, NT.  No adnexal mass felt, NT.  CT scan 09/30/2016:   Reproductive: Normal uterus and left ovary. 3.5 cm cyst in the right Ovary. No free air, free fluid, or intra-abdominal fluid collection. Musculoskeletal: There are no acute or suspicious osseous abnormalities. IMPRESSION: 1. Right ovarian cyst measuring 3.5 cm, no dedicated further imaging follow-up is needed. This recommendation follows ACR consensus guidelines 2. Otherwise no acute abnormality in the abdomen/pelvis.   Assessment/Plan:  31 y.o. G0P0000   1. Cyst of right ovary Rt Ovarian functional cyst 3.5 cm.  Normal Gyn exam.  Reassured.  Will start on Yaz to decrease the risk of recurrent painful functional Ovarian Cysts.  2. Acne vulgaris Opted for Yaz to address acne as we control her menstrual cycle.  Dermato as  needed.  3. Type 2 diabetes mellitus without complication, without long-term current use of insulin (HCC) Will refer to Endocrino to continue optimal treatment of DM type 2.  4. Morbid obesity (HCC) Lost 30 Lbs since last year.  Continue with Low Calorie/Low Carb/Portion control diet.  Suggested [facility] Diet as a guide.  Walking regularly recommended.  Counseling on above issues >50% x 25 minutes.  Genia DelMarie-Lyne Randal Goens MD, 10:04 AM 10/07/2016

## 2016-10-07 NOTE — Patient Instructions (Signed)
1. Cyst of right ovary Rt Ovarian functional cyst 3.5 cm.  Normal Gyn exam.  Reassured.  Will start on Yaz to decrease the risk of recurrent painful functional Ovarian Cysts.  2. Acne vulgaris Opted for Yaz to address acne as we control her menstrual cycle.  Dermato as needed.  3. Type 2 diabetes mellitus without complication, without long-term current use of insulin ([place]) Will refer to Endocrino to continue optimal treatment of DM type 2.  4. Morbid obesity ([place]) Lost 30 Lbs since last year.  Continue with Low Calorie/Low Carb/Portion control diet.  Suggested [place] as a guide.  Walking regularly recommended.  Kristi Jacobson, it was a pleasure to see you today!  You will receive a phone call from my office to organize your Endocrinology follow up.  You can start the Yaz BCP right today.  See you again soon!  Health Maintenance, Female Adopting a healthy lifestyle and getting preventive care can go a long way to promote health and wellness. Talk with your health care provider about what schedule of regular examinations is right for you. This is a good chance for you to check in with your provider about disease prevention and staying healthy. In between checkups, there are plenty of things you can do on your own. Experts have done a lot of research about which lifestyle changes and preventive measures are most likely to keep you healthy. Ask your health care provider for more information. Weight and diet Eat a healthy diet  Be sure to include plenty of vegetables, fruits, low-fat dairy products, and lean protein.  Do not eat a lot of foods high in solid fats, added sugars, or salt.  Get regular exercise. This is one of the most important things you can do for your health. ? Most adults should exercise for at least 150 minutes each week. The exercise should increase your heart rate and make you sweat (moderate-intensity exercise). ? Most adults should also do strengthening exercises at  least twice a week. This is in addition to the moderate-intensity exercise.  Maintain a healthy weight  Body mass index (BMI) is a measurement that can be used to identify possible weight problems. It estimates body fat based on height and weight. Your health care provider can help determine your BMI and help you achieve or maintain a healthy weight.  For females 19 years of age and older: ? A BMI below 18.5 is considered underweight. ? A BMI of 18.5 to 24.9 is normal. ? A BMI of 25 to 29.9 is considered overweight. ? A BMI of 30 and above is considered obese.  Watch levels of cholesterol and blood lipids  You should start having your blood tested for lipids and cholesterol at 30 years of age, then have this test every 5 years.  You may need to have your cholesterol levels checked more often if: ? Your lipid or cholesterol levels are high. ? You are older than 30 years of age. ? You are at high risk for heart disease.  Cancer screening Lung Cancer  Lung cancer screening is recommended for adults 31-71 years old who are at high risk for lung cancer because of a history of smoking.  A yearly low-dose CT scan of the lungs is recommended for people who: ? Currently smoke. ? Have quit within the past 15 years. ? Have at least a 30-pack-year history of smoking. A pack year is smoking an average of one pack of cigarettes a day for 1 year.  Yearly screening should continue until it has been 15 years since you quit.  Yearly screening should stop if you develop a health problem that would prevent you from having lung cancer treatment.  Breast Cancer  Practice breast self-awareness. This means understanding how your breasts normally appear and feel.  It also means doing regular breast self-exams. Let your health care provider know about any changes, no matter how small.  If you are in your 20s or 30s, you should have a clinical breast exam (CBE) by a health care provider every 1-3 years  as part of a regular health exam.  If you are 40 or older, have a CBE every year. Also consider having a breast X-ray (mammogram) every year.  If you have a family history of breast cancer, talk to your health care provider about genetic screening.  If you are at high risk for breast cancer, talk to your health care provider about having an MRI and a mammogram every year.  Breast cancer gene (BRCA) assessment is recommended for women who have family members with BRCA-related cancers. BRCA-related cancers include: ? Breast. ? Ovarian. ? Tubal. ? Peritoneal cancers.  Results of the assessment will determine the need for genetic counseling and BRCA1 and BRCA2 testing.  Cervical Cancer Your health care provider may recommend that you be screened regularly for cancer of the pelvic organs (ovaries, uterus, and vagina). This screening involves a pelvic examination, including checking for microscopic changes to the surface of your cervix (Pap test). You may be encouraged to have this screening done every 3 years, beginning at age 45.  For women ages 32-65, health care providers may recommend pelvic exams and Pap testing every 3 years, or they may recommend the Pap and pelvic exam, combined with testing for human papilloma virus (HPV), every 5 years. Some types of HPV increase your risk of cervical cancer. Testing for HPV may also be done on women of any age with unclear Pap test results.  Other health care providers may not recommend any screening for nonpregnant women who are considered low risk for pelvic cancer and who do not have symptoms. Ask your health care provider if a screening pelvic exam is right for you.  If you have had past treatment for cervical cancer or a condition that could lead to cancer, you need Pap tests and screening for cancer for at least 20 years after your treatment. If Pap tests have been discontinued, your risk factors (such as having a new sexual partner) need to be  reassessed to determine if screening should resume. Some women have medical problems that increase the chance of getting cervical cancer. In these cases, your health care provider may recommend more frequent screening and Pap tests.  Colorectal Cancer  This type of cancer can be detected and often prevented.  Routine colorectal cancer screening usually begins at 30 years of age and continues through 30 years of age.  Your health care provider may recommend screening at an earlier age if you have risk factors for colon cancer.  Your health care provider may also recommend using home test kits to check for hidden blood in the stool.  A small camera at the end of a tube can be used to examine your colon directly (sigmoidoscopy or colonoscopy). This is done to check for the earliest forms of colorectal cancer.  Routine screening usually begins at age 30.  Direct examination of the colon should be repeated every 5-10 years through 30 years of age.  However, you may need to be screened more often if early forms of precancerous polyps or small growths are found.  Skin Cancer  Check your skin from head to toe regularly.  Tell your health care provider about any new moles or changes in moles, especially if there is a change in a mole's shape or color.  Also tell your health care provider if you have a mole that is larger than the size of a pencil eraser.  Always use sunscreen. Apply sunscreen liberally and repeatedly throughout the day.  Protect yourself by wearing long sleeves, pants, a wide-brimmed hat, and sunglasses whenever you are outside.  Heart disease, diabetes, and high blood pressure  High blood pressure causes heart disease and increases the risk of stroke. High blood pressure is more likely to develop in: ? People who have blood pressure in the high end of the normal range (130-139/85-89 mm Hg). ? People who are overweight or obese. ? People who are African American.  If you  are 22-12 years of age, have your blood pressure checked every 3-5 years. If you are 32 years of age or older, have your blood pressure checked every year. You should have your blood pressure measured twice-once when you are at a hospital or clinic, and once when you are not at a hospital or clinic. Record the average of the two measurements. To check your blood pressure when you are not at a hospital or clinic, you can use: ? An automated blood pressure machine at a pharmacy. ? A home blood pressure monitor.  If you are between 41 years and 34 years old, ask your health care provider if you should take aspirin to prevent strokes.  Have regular diabetes screenings. This involves taking a blood sample to check your fasting blood sugar level. ? If you are at a normal weight and have a low risk for diabetes, have this test once every three years after 30 years of age. ? If you are overweight and have a high risk for diabetes, consider being tested at a younger age or more often. Preventing infection Hepatitis B  If you have a higher risk for hepatitis B, you should be screened for this virus. You are considered at high risk for hepatitis B if: ? You were born in a country where hepatitis B is common. Ask your health care provider which countries are considered high risk. ? Your parents were born in a high-risk country, and you have not been immunized against hepatitis B (hepatitis B vaccine). ? You have HIV or AIDS. ? You use needles to inject street drugs. ? You live with someone who has hepatitis B. ? You have had sex with someone who has hepatitis B. ? You get hemodialysis treatment. ? You take certain medicines for conditions, including cancer, organ transplantation, and autoimmune conditions.  Hepatitis C  Blood testing is recommended for: ? Everyone born from [birth year] through 1964. ? Anyone with known risk factors for hepatitis C.  Sexually transmitted infections (STIs)  You should be  screened for sexually transmitted infections (STIs) including gonorrhea and chlamydia if: ? You are sexually active and are younger than 30 years of age. ? You are older than 30 years of age and your health care provider tells you that you are at risk for this type of infection. ? Your sexual activity has changed since you were last screened and you are at an increased risk for chlamydia or gonorrhea. Ask your health care provider if  you are at risk.  If you do not have HIV, but are at risk, it may be recommended that you take a prescription medicine daily to prevent HIV infection. This is called pre-exposure prophylaxis (PrEP). You are considered at risk if: ? You are sexually active and do not regularly use condoms or know the HIV status of your partner(s). ? You take drugs by injection. ? You are sexually active with a partner who has HIV.  Talk with your health care provider about whether you are at high risk of being infected with HIV. If you choose to begin PrEP, you should first be tested for HIV. You should then be tested every 3 months for as long as you are taking PrEP. Pregnancy  If you are premenopausal and you may become pregnant, ask your health care provider about preconception counseling.  If you may become pregnant, take 400 to 800 micrograms (mcg) of folic acid every day.  If you want to prevent pregnancy, talk to your health care provider about birth control (contraception). Osteoporosis and menopause  Osteoporosis is a disease in which the bones lose minerals and strength with aging. This can result in serious bone fractures. Your risk for osteoporosis can be identified using a bone density scan.  If you are 57 years of age or older, or if you are at risk for osteoporosis and fractures, ask your health care provider if you should be screened.  Ask your health care provider whether you should take a calcium or vitamin D supplement to lower your risk for  osteoporosis.  Menopause may have certain physical symptoms and risks.  Hormone replacement therapy may reduce some of these symptoms and risks. Talk to your health care provider about whether hormone replacement therapy is right for you. Follow these instructions at home:  Schedule regular health, dental, and eye exams.  Stay current with your immunizations.  Do not use any tobacco products including cigarettes, chewing tobacco, or electronic cigarettes.  If you are pregnant, do not drink alcohol.  If you are breastfeeding, limit how much and how often you drink alcohol.  Limit alcohol intake to no more than 1 drink per day for nonpregnant women. One drink equals 12 ounces of beer, 5 ounces of wine, or 1 ounces of hard liquor.  Do not use street drugs.  Do not share needles.  Ask your health care provider for help if you need support or information about quitting drugs.  Tell your health care provider if you often feel depressed.  Tell your health care provider if you have ever been abused or do not feel safe at home. This information is not intended to replace advice given to you by your health care provider. Make sure you discuss any questions you have with your health care provider. Document Released: 08/10/2010 Document Revised: 07/03/2015 Document Reviewed: 10/29/2014 Elsevier Interactive Patient Education  [employer].

## 2016-10-12 ENCOUNTER — Telehealth: Payer: Self-pay | Admitting: *Deleted

## 2016-10-12 NOTE — Telephone Encounter (Signed)
-----   Message from Genia DelMarie-Lyne Lavoie, MD sent at 10/07/2016 10:22 AM EDT ----- Regarding: Refer to Endocrinologist DM type 2 on Metformin.  Would like to see a different Endocrinologist.  Displeased with last visit with Dr Everardo AllEllison.

## 2016-10-12 NOTE — Telephone Encounter (Signed)
Office notes faxed to Dr.Balan office they will fax me back with time and date.

## 2016-11-02 NOTE — Telephone Encounter (Signed)
I called pt and she is scheduled with Dr.Balan on 11/18/16.

## 2016-11-21 ENCOUNTER — Other Ambulatory Visit: Payer: Self-pay | Admitting: Endocrinology

## 2016-12-05 ENCOUNTER — Other Ambulatory Visit: Payer: Self-pay | Admitting: Endocrinology

## 2016-12-05 NOTE — Telephone Encounter (Signed)
Please refill x 1 Ov is due  

## 2016-12-06 ENCOUNTER — Other Ambulatory Visit: Payer: Self-pay

## 2016-12-06 MED ORDER — DROSPIRENONE-ETHINYL ESTRADIOL 3-0.02 MG PO TABS: 1.0000 | ORAL_TABLET | Freq: Every day | ORAL | 3 refills | Status: DC

## 2016-12-06 MED ORDER — METFORMIN HCL ER 500 MG PO TB24: 500.0000 mg | ORAL_TABLET | Freq: Every day | ORAL | 1 refills | Status: DC

## 2016-12-15 ENCOUNTER — Other Ambulatory Visit: Payer: Self-pay

## 2016-12-15 MED ORDER — DROSPIRENONE-ETHINYL ESTRADIOL 3-0.02 MG PO TABS: 1.0000 | ORAL_TABLET | Freq: Every day | ORAL | 3 refills | Status: DC

## 2017-01-06 ENCOUNTER — Encounter (HOSPITAL_COMMUNITY): Payer: Self-pay | Admitting: Emergency Medicine

## 2017-01-06 ENCOUNTER — Emergency Department (HOSPITAL_COMMUNITY): Payer: BLUE CROSS/BLUE SHIELD

## 2017-01-06 ENCOUNTER — Observation Stay (HOSPITAL_COMMUNITY)
Admission: EM | Admit: 2017-01-06 | Discharge: 2017-01-08 | Disposition: A | Discharge status: Home / Self Care | Payer: BLUE CROSS/BLUE SHIELD | Attending: Student in an Organized Health Care Education/Training Program | Admitting: Student in an Organized Health Care Education/Training Program

## 2017-01-06 DIAGNOSIS — K529 Noninfective gastroenteritis and colitis, unspecified: Secondary | ICD-10-CM | POA: Diagnosis not present

## 2017-01-06 DIAGNOSIS — N920 Excessive and frequent menstruation with regular cycle: Secondary | ICD-10-CM | POA: Insufficient documentation

## 2017-01-06 DIAGNOSIS — R1084 Generalized abdominal pain: Secondary | ICD-10-CM

## 2017-01-06 DIAGNOSIS — E119 Type 2 diabetes mellitus without complications: Secondary | ICD-10-CM | POA: Insufficient documentation

## 2017-01-06 DIAGNOSIS — A084 Viral intestinal infection, unspecified: Principal | ICD-10-CM | POA: Insufficient documentation

## 2017-01-06 DIAGNOSIS — D72829 Elevated white blood cell count, unspecified: Secondary | ICD-10-CM

## 2017-01-06 DIAGNOSIS — R000 Tachycardia, unspecified: Secondary | ICD-10-CM | POA: Diagnosis present

## 2017-01-06 DIAGNOSIS — Z880 Allergy status to penicillin: Secondary | ICD-10-CM | POA: Diagnosis not present

## 2017-01-06 DIAGNOSIS — J45909 Unspecified asthma, uncomplicated: Secondary | ICD-10-CM | POA: Diagnosis not present

## 2017-01-06 DIAGNOSIS — Z881 Allergy status to other antibiotic agents status: Secondary | ICD-10-CM | POA: Insufficient documentation

## 2017-01-06 DIAGNOSIS — R112 Nausea with vomiting, unspecified: Secondary | ICD-10-CM | POA: Diagnosis present

## 2017-01-06 DIAGNOSIS — K900 Celiac disease: Secondary | ICD-10-CM | POA: Diagnosis not present

## 2017-01-06 DIAGNOSIS — A419 Sepsis, unspecified organism: Secondary | ICD-10-CM

## 2017-01-06 DIAGNOSIS — Z7984 Long term (current) use of oral hypoglycemic drugs: Secondary | ICD-10-CM | POA: Diagnosis not present

## 2017-01-06 DIAGNOSIS — Z79899 Other long term (current) drug therapy: Secondary | ICD-10-CM | POA: Insufficient documentation

## 2017-01-06 DIAGNOSIS — E872 Acidosis: Secondary | ICD-10-CM | POA: Diagnosis not present

## 2017-01-06 DIAGNOSIS — E669 Obesity, unspecified: Secondary | ICD-10-CM | POA: Insufficient documentation

## 2017-01-06 DIAGNOSIS — R197 Diarrhea, unspecified: Secondary | ICD-10-CM

## 2017-01-06 LAB — URINALYSIS, ROUTINE W REFLEX MICROSCOPIC
Bilirubin Urine: NEGATIVE
GLUCOSE, UA: NEGATIVE mg/dL
Hgb urine dipstick: NEGATIVE
Ketones, ur: NEGATIVE mg/dL
LEUKOCYTES UA: NEGATIVE
Nitrite: NEGATIVE
PH: 7 (ref 5.0–8.0)
PROTEIN: NEGATIVE mg/dL
Specific Gravity, Urine: 1.005 (ref 1.005–1.030)

## 2017-01-06 LAB — CBC WITH DIFFERENTIAL/PLATELET
BASOS ABS: 0 10*3/uL (ref 0.0–0.1)
BASOS PCT: 0 %
EOS ABS: 0.1 10*3/uL (ref 0.0–0.7)
Eosinophils Relative: 0 %
HCT: 40 % (ref 36.0–46.0)
Hemoglobin: 13.2 g/dL (ref 12.0–15.0)
Lymphocytes Relative: 5 %
Lymphs Abs: 1.1 10*3/uL (ref 0.7–4.0)
MCH: 25.1 pg — ABNORMAL LOW (ref 26.0–34.0)
MCHC: 33 g/dL (ref 30.0–36.0)
MCV: 76 fL — ABNORMAL LOW (ref 78.0–100.0)
MONO ABS: 0.9 10*3/uL (ref 0.1–1.0)
MONOS PCT: 4 %
NEUTROS ABS: 20.5 10*3/uL — AB (ref 1.7–7.7)
Neutrophils Relative %: 91 %
PLATELETS: 305 10*3/uL (ref 150–400)
RBC: 5.26 MIL/uL — ABNORMAL HIGH (ref 3.87–5.11)
RDW: 14.1 % (ref 11.5–15.5)
WBC: 22.5 10*3/uL — ABNORMAL HIGH (ref 4.0–10.5)

## 2017-01-06 LAB — GLUCOSE, CAPILLARY
GLUCOSE-CAPILLARY: 133 mg/dL — AB (ref 65–99)
Glucose-Capillary: 102 mg/dL — ABNORMAL HIGH (ref 65–99)

## 2017-01-06 LAB — COMPREHENSIVE METABOLIC PANEL
ALBUMIN: 3.9 g/dL (ref 3.5–5.0)
ALK PHOS: 66 U/L (ref 38–126)
ALT: 26 U/L (ref 14–54)
ANION GAP: 13 (ref 5–15)
AST: 26 U/L (ref 15–41)
BILIRUBIN TOTAL: 0.4 mg/dL (ref 0.3–1.2)
BUN: 10 mg/dL (ref 6–20)
CALCIUM: 9.2 mg/dL (ref 8.9–10.3)
CO2: 21 mmol/L — ABNORMAL LOW (ref 22–32)
CREATININE: 0.66 mg/dL (ref 0.44–1.00)
Chloride: 102 mmol/L (ref 101–111)
GFR calc non Af Amer: >60 mL/min (ref >60)
GLUCOSE: 153 mg/dL — AB (ref 65–99)
Potassium: 3.7 mmol/L (ref 3.5–5.1)
Sodium: 136 mmol/L (ref 135–145)
TOTAL PROTEIN: 7.3 g/dL (ref 6.5–8.1)

## 2017-01-06 LAB — PROCALCITONIN

## 2017-01-06 LAB — I-STAT BETA HCG BLOOD, ED (MC, WL, AP ONLY): I-stat hCG, quantitative: <5 m[IU]/mL (ref <5)

## 2017-01-06 LAB — I-STAT CG4 LACTIC ACID, ED
LACTIC ACID, VENOUS: 3.45 mmol/L — AB (ref 0.5–1.9)
Lactic Acid, Venous: 3.09 mmol/L (ref 0.5–1.9)
Lactic Acid, Venous: 4.26 mmol/L (ref 0.5–1.9)

## 2017-01-06 LAB — LACTIC ACID, PLASMA
Lactic Acid, Venous: 1.2 mmol/L (ref 0.5–1.9)
Lactic Acid, Venous: 1.1 mmol/L (ref 0.5–1.9)

## 2017-01-06 LAB — HEMOGLOBIN A1C
Hgb A1c MFr Bld: 6.2 % — ABNORMAL HIGH (ref 4.8–5.6)
Mean Plasma Glucose: 131.24 mg/dL

## 2017-01-06 LAB — LIPASE, BLOOD: Lipase: 26 U/L (ref 11–51)

## 2017-01-06 LAB — CBG MONITORING, ED
GLUCOSE-CAPILLARY: 180 mg/dL — AB (ref 65–99)
Glucose-Capillary: 88 mg/dL (ref 65–99)

## 2017-01-06 MED ORDER — ONDANSETRON 4 MG PO TBDP: ORAL_TABLET | ORAL | 0 refills | Status: DC

## 2017-01-06 MED ORDER — ONDANSETRON HCL 4 MG PO TABS
4.0000 mg | ORAL_TABLET | Freq: Four times a day (QID) | ORAL | Status: DC | PRN
  Administered 2017-01-06: 4 mg via ORAL
  Filled 2017-01-06: qty 1

## 2017-01-06 MED ORDER — METRONIDAZOLE IN NACL 5-0.79 MG/ML-% IV SOLN: 500.0000 mg | Freq: Three times a day (TID) | INTRAVENOUS | Status: DC

## 2017-01-06 MED ORDER — ACETAMINOPHEN 325 MG PO TABS
650.0000 mg | ORAL_TABLET | Freq: Four times a day (QID) | ORAL | Status: DC | PRN
  Administered 2017-01-06 – 2017-01-08 (×4): 650 mg via ORAL
  Filled 2017-01-06 (×4): qty 2

## 2017-01-06 MED ORDER — SODIUM CHLORIDE 0.9 % IV BOLUS (SEPSIS)
1000.0000 mL | Freq: Once | INTRAVENOUS | Status: AC
  Administered 2017-01-06: 1000 mL via INTRAVENOUS

## 2017-01-06 MED ORDER — ONDANSETRON HCL 4 MG/2ML IJ SOLN
4.0000 mg | Freq: Four times a day (QID) | INTRAMUSCULAR | Status: DC | PRN
  Administered 2017-01-06: 4 mg via INTRAVENOUS

## 2017-01-06 MED ORDER — LEVOFLOXACIN IN D5W 750 MG/150ML IV SOLN
750.0000 mg | Freq: Once | INTRAVENOUS | Status: AC
  Administered 2017-01-06: 750 mg via INTRAVENOUS
  Filled 2017-01-06: qty 150

## 2017-01-06 MED ORDER — ACETAMINOPHEN 650 MG RE SUPP: 650.0000 mg | Freq: Four times a day (QID) | RECTAL | Status: DC | PRN

## 2017-01-06 MED ORDER — IOPAMIDOL (ISOVUE-300) INJECTION 61%
INTRAVENOUS | Status: AC
  Administered 2017-01-06: 100 mL
  Filled 2017-01-06: qty 100

## 2017-01-06 MED ORDER — ONDANSETRON HCL 4 MG/2ML IJ SOLN
4.0000 mg | Freq: Once | INTRAMUSCULAR | Status: AC
  Administered 2017-01-06: 4 mg via INTRAVENOUS
  Filled 2017-01-06: qty 2

## 2017-01-06 MED ORDER — INSULIN ASPART 100 UNIT/ML ~~LOC~~ SOLN: 0.0000 [IU] | Freq: Three times a day (TID) | SUBCUTANEOUS | Status: DC

## 2017-01-06 MED ORDER — LEVOFLOXACIN IN D5W 750 MG/150ML IV SOLN: 750.0000 mg | INTRAVENOUS | Status: DC

## 2017-01-06 MED ORDER — POTASSIUM CHLORIDE IN NACL 20-0.9 MEQ/L-% IV SOLN
INTRAVENOUS | Status: AC
  Administered 2017-01-06 – 2017-01-07 (×3): via INTRAVENOUS
  Filled 2017-01-06 (×5): qty 1000

## 2017-01-06 MED ORDER — METRONIDAZOLE IN NACL 5-0.79 MG/ML-% IV SOLN
500.0000 mg | Freq: Once | INTRAVENOUS | Status: AC
  Administered 2017-01-06: 500 mg via INTRAVENOUS
  Filled 2017-01-06: qty 100

## 2017-01-06 MED ORDER — ENOXAPARIN SODIUM 40 MG/0.4ML ~~LOC~~ SOLN
40.0000 mg | SUBCUTANEOUS | Status: DC
  Filled 2017-01-06 (×3): qty 0.4

## 2017-01-06 MED ORDER — LACTATED RINGERS IV BOLUS (SEPSIS)
1000.0000 mL | Freq: Once | INTRAVENOUS | Status: AC
  Administered 2017-01-06: 1000 mL via INTRAVENOUS

## 2017-01-06 NOTE — H&P (Signed)
Date: 01/06/2017               Patient Name:  Kristi Jacobson MRN: [id number]  DOB: [birth year] Age / Sex: 31 y.o., female   PCP: Genia DelLavoie, Marie-Lyne, MD         Medical Service: Internal Medicine Teaching Service         Attending Physician: Dr. Oswaldo DoneVincent, Marquita Palmsuncan Thomas, *    First Contact: Dr. Evelene CroonSantos Pager: [phone]  Second Contact: Dr. Mikey BussingHoffman Pager: [phone]       After Hours (After 5p/  First Contact Pager: [phone]  weekends / holidays): Second Contact Pager: [phone]   Chief Complaint: vomiting and diarrhea  History of Present Illness:  Ms. Kristi Jacobson is a 31 year old female with Celiac's disease, T2DM, and obesity who presents with nausea, vomiting, and diarrhea. She states she was babysitting her niece yesterday (11/28) when she began to feel queasy around 6 pm. Around 11 pm, she began to have episodes of emesis of her previously ingested food (spinach salad for lunch, PB&J afterwards). She says she brought her own food which was gluten free. She had three further episodes of emesis which were clear. She had associated brown watery diarrhea. She says her niece and brother (niece's father) recently recovered from a stomach "bug" last week which did not require medical attention. No recent antibiotic use.  She has had intermittent generalized abdominal "gnawing" aches since her symptoms began. She reports recovering from a recent URI with continued nasal congestion and rhinorrhea.  She has had some chills without fever, chest pain, shortness of breath, hematemesis, hematochezia, or melena. She denies any dysuria. She has had some lightheadedness without loss of consciousness.   In the ED, initial vitals were: BP 112/77, Pulse 109, RR 17, O2 96% on RA, Temp 98.1 F  Initial lab work was notable for a leukocytosis of 22.5. Lipase and beta hCG were negative. Initial lactic acid was 3.09 which trended up to 3.45 >> 4.26 despite fluid resuscitation. She received 3 Liters of NS  and 1 L lactated ringers. Her nausea improved with Zofran. She had a CT abdomen/pelvis w/ contrast which was read as normal appendix, normal appearance of stomach and bowel loops, no free air or fluid.  Meds:  Current Meds  Medication Sig  . metFORMIN (GLUCOPHAGE-XR) 500 MG 24 hr tablet Take 1 tablet (500 mg total) by mouth daily with breakfast.  . Multiple Vitamin (MULTIVITAMIN WITH MINERALS) TABS tablet Take 1 tablet by mouth daily.     Allergies: Allergies as of 01/06/2017 - Review Complete 01/06/2017  Allergen Reaction Noted  . Cephalosporins Anaphylaxis 04/19/2014  . Penicillins Anaphylaxis 04/19/2014   Past Medical History:  Diagnosis Date  . Asthma   . Celiac disease   . Menorrhagia     Family History:  Brother - Diabetes  Social History:  Never smoker, no alcohol use, no illicit drug use. [place] by herself in [place]. Works at [employer].  Review of Systems: A complete ROS was negative except as per HPI.   Physical Exam: Blood pressure 125/86, pulse (!) 128, temperature 99.8 F (37.7 C), temperature source Oral, resp. rate 20, last menstrual period 12/30/2016, SpO2 98 %. Physical Exam  Constitutional: She is oriented to person, place, and time. She appears well-developed and well-nourished. No distress.  HENT:  Head: Normocephalic and atraumatic.  Eyes: EOM are normal.  Cardiovascular: Regular rhythm and intact distal pulses.  No murmur heard. tachycardic  Pulmonary/Chest: Effort normal. No respiratory  distress. She has no wheezes. She has no rhonchi. She has no rales.  Abdominal: Bowel sounds are normal. She exhibits no distension and no ascites. There is no tenderness. There is no rigidity, no guarding and no tenderness at McBurney's point.  Obese abdomen with striae  Neurological: She is alert and oriented to person, place, and time.  Skin: Skin is warm. Capillary refill takes less than 2 seconds. She is not diaphoretic.  Acne vulgaris    Psychiatric: She has a normal mood and affect.     EKG: personally reviewed my interpretation is Sinus tachyardia, rate 120 bpm, TWI lead III  CXR: personally reviewed my interpretation is PA view, lungs clear without consolidation, effusion, or pulmonary edema.  Assessment & Plan by Problem: Principal Problem:   Gastroenteritis Active Problems:   Celiac disease   T2DM (type 2 diabetes mellitus) (HCC)  31 year old female with Celiac's disease, T2DM, and obesity who presents with gastroenteritis and lactic acidosis.  Gastroenteritis: Patient's symptoms consistent with infectious gastroenteritis, likely viral in nature given her sick contacts. She denies eating anything out of the ordinary and is confident she has avoided gluten. She does have a leukocytosis, however is afebrile and otherwise appears well. She was given Flagyl and Levaquin in the ED (due to PCN and cephalosporin allergy).  - Will hold off on further antibiotics for now - Continue IVF NS @ 150/hr - Zofran prn - f/u GI path panel - Check procalcitonin  Lactic Acidosis She has a lactic acidosis which has trended up to 4.26 despite IV fluid resuscitation. This is likely from GI losses and hypoperfusion. Would also consider mesenteric ischemia, however her CT abd/pelvis was not revealing (CTA would be preferred study) and she is not having significant abdominal pain or bloody stools. She has also been on Metformin which is associated with lactic acidosis. - Continue IVF - Trend Lactic acid - Hold metformin  Celiac disease: Patient with reported Celiac's disease that was unofficially diagnosed by improvement in GI symptoms after she excluded gluten from her diet. She is confident she avoided gluten products prior to her current symptoms.  - NPO sips with meds for now - Gluten free diet when tolerating oral intake    Dispo: Admit patient to Observation with expected length of stay less than 2  midnights.  Signed: Darreld McleanPatel, Kadie Balestrieri, MD 01/06/2017, 1:20 PM

## 2017-01-06 NOTE — Progress Notes (Signed)
Dear Doctor:  This patient has been identified as a candidate for PICC for the following reason (s): IV therapy over 48 hours, drug pH or osmolality (causing phlebitis, infiltration in 24 hours) and poor veins/poor circulatory system (CHF, COPD, emphysema, diabetes, steroid use, IV drug abuse, etc.) If you agree, please write an order for the indicated device. For any questions contact the Vascular Access Team at [phone] if no answer, please leave a message.  Thank you for supporting the early vascular access assessment program. 

## 2017-01-06 NOTE — Progress Notes (Signed)
1850 Notified Dr. Evelene CroonSantos of elevated temp.

## 2017-01-06 NOTE — ED Provider Notes (Signed)
Patient seen/examined in the Emergency Department in conjunction with Midlevel Provider  Patient reports abdominal cramping, 2-3 episodes of vomiting one episode of loose stool Exam : Awake alert, no distress, no focal abdominal tenderness Plan: We will need monitoring, IV fluids and reassessment At this point will defer CT imaging   Zadie RhineWickline, Tremaine Fuhriman, MD 01/06/17 0502

## 2017-01-06 NOTE — ED Provider Notes (Signed)
Patient care assume from [facility], PA-C at shift change.  Please see her note for further. Briefly, patient presented with nausea, vomiting and diarrhea with onset about 4 hours prior to arrival.  Abdomen was soft and nontender and suspected gastroenteritis.  Her initial lactic acid was 3.09 with a white count of 22,000.  Plan is for fluids, Zofran and to repeat her lactic acid.  07:55 am - After 2 L fluid bolus patient's repeat lactic acid is 3.45.  I went to bedside to evaluate patient. She reports she still having some mild nausea as well as some generalized pain.  She has resolved.  Patient has mild generalized abdominal tenderness to palpation without focal areas of tenderness.  She is still tachycardic with a heart rate around 118 on my evaluation.  Will activate code sepsis at this time as patient has had worsening lactic acid despite fluid bolus.  Additional fluid bolus ordered.  Will start on antibiotics with Levaquin and Flagyl due to penicillin and cephalosporin allergy.  Will obtain CT abdomen and pelvis with contrast.  Patient agrees with plan.  CT abdomen and pelvis with contrast shows no acute findings.  Line at reevaluation patient is still persistently tachycardic with a heart rate of 120.  She is working on her fourth liter fluid bolus.  We discussed test results.  Plan is for admission. Patient agrees with plan.   I consulted with family medicine teaching service who accepted the patient for admission.   This patient was discussed with Dr. Eudelia Bunch who agrees with assessment and plan.   CRITICAL CARE Performed by: Lawana Chambers   Total critical care time: 35 minutes  Critical care time was exclusive of separately billable procedures and treating other patients.  Critical care was necessary to treat or prevent imminent or life-threatening deterioration.  Critical care was time spent personally by me on the following activities: development of treatment plan  with patient and/or surrogate as well as nursing, discussions with consultants, evaluation of patient's response to treatment, examination of patient, obtaining history from patient or surrogate, ordering and performing treatments and interventions, ordering and review of laboratory studies, ordering and review of radiographic studies, pulse oximetry and re-evaluation of patient's condition.    Results for orders placed or performed during the hospital encounter of 01/06/17  Comprehensive metabolic panel  Result Value Ref Range   Sodium 136 135 - 145 mmol/L   Potassium 3.7 3.5 - 5.1 mmol/L   Chloride 102 101 - 111 mmol/L   CO2 21 (L) 22 - 32 mmol/L   Glucose, Bld 153 (H) 65 - 99 mg/dL   BUN 10 6 - 20 mg/dL   Creatinine, Ser 1.30 0.44 - 1.00 mg/dL   Calcium 9.2 8.9 - 86.5 mg/dL   Total Protein 7.3 6.5 - 8.1 g/dL   Albumin 3.9 3.5 - 5.0 g/dL   AST 26 15 - 41 U/L   ALT 26 14 - 54 U/L   Alkaline Phosphatase 66 38 - 126 U/L   Total Bilirubin 0.4 0.3 - 1.2 mg/dL   GFR calc non Af Amer >60 >60 mL/min   GFR calc Af Amer >60 >60 mL/min   Anion gap 13 5 - 15  CBC with Differential/Platelet  Result Value Ref Range   WBC 22.5 (H) 4.0 - 10.5 K/uL   RBC 5.26 (H) 3.87 - 5.11 MIL/uL   Hemoglobin 13.2 12.0 - 15.0 g/dL   HCT 78.4 69.6 - 29.5 %   MCV 76.0 (L) 78.0 -  100.0 fL   MCH 25.1 (L) 26.0 - 34.0 pg   MCHC 33.0 30.0 - 36.0 g/dL   RDW 81.114.1 91.411.5 - 78.215.5 %   Platelets 305 150 - 400 K/uL   Neutrophils Relative % 91 %   Neutro Abs 20.5 (H) 1.7 - 7.7 K/uL   Lymphocytes Relative 5 %   Lymphs Abs 1.1 0.7 - 4.0 K/uL   Monocytes Relative 4 %   Monocytes Absolute 0.9 0.1 - 1.0 K/uL   Eosinophils Relative 0 %   Eosinophils Absolute 0.1 0.0 - 0.7 K/uL   Basophils Relative 0 %   Basophils Absolute 0.0 0.0 - 0.1 K/uL  Lipase, blood  Result Value Ref Range   Lipase 26 11 - 51 U/L  Urinalysis, Routine w reflex microscopic  Result Value Ref Range   Color, Urine COLORLESS (A) YELLOW   APPearance  CLEAR CLEAR   Specific Gravity, Urine 1.005 1.005 - 1.030   pH 7.0 5.0 - 8.0   Glucose, UA NEGATIVE NEGATIVE mg/dL   Hgb urine dipstick NEGATIVE NEGATIVE   Bilirubin Urine NEGATIVE NEGATIVE   Ketones, ur NEGATIVE NEGATIVE mg/dL   Protein, ur NEGATIVE NEGATIVE mg/dL   Nitrite NEGATIVE NEGATIVE   Leukocytes, UA NEGATIVE NEGATIVE  I-Stat Beta hCG blood, ED ([facility], [facility], [facility] only)  Result Value Ref Range   I-stat hCG, quantitative <5.0 <5 mIU/mL   Comment 3          I-Stat CG4 Lactic Acid, ED  Result Value Ref Range   Lactic Acid, Venous 3.09 (HH) 0.5 - 1.9 mmol/L   Comment NOTIFIED PHYSICIAN   POC CBG, ED  Result Value Ref Range   Glucose-Capillary 180 (H) 65 - 99 mg/dL  I-Stat CG4 Lactic Acid, ED  Result Value Ref Range   Lactic Acid, Venous 3.45 (HH) 0.5 - 1.9 mmol/L   Comment NOTIFIED PHYSICIAN    Ct Abdomen Pelvis W Contrast  Result Date: 01/06/2017 CLINICAL DATA:  Abdominal pain, suspected diverticulitis EXAM: CT ABDOMEN AND PELVIS WITH CONTRAST TECHNIQUE: Multidetector CT imaging of the abdomen and pelvis was performed using the standard protocol following bolus administration of intravenous contrast. Sagittal and coronal MPR images reconstructed from axial data set. CONTRAST:  100mL ISOVUE-300 IOPAMIDOL (ISOVUE-300) INJECTION 61% IV. No oral contrast. COMPARISON:  09/30/2016 FINDINGS: Lower chest: Lung bases clear Hepatobiliary: Mild focal fatty infiltration of liver adjacent to falciform fissure. Gallbladder and liver otherwise normal appearance Pancreas: Normal appearance Spleen: Normal appearance Adrenals/Urinary Tract: Adrenal glands, kidneys, ureters, and bladder normal appearance Stomach/Bowel: Normal appendix. Stomach and bowel loops normal appearance for technique. Vascular/Lymphatic: Unremarkable Reproductive: Uterus and adnexa normal appearance Other: No free air or free fluid. No hernia or acute inflammatory process. Musculoskeletal: Unremarkable IMPRESSION: No acute  intra-abdominal or intrapelvic abnormalities. Electronically Signed   By: Ulyses SouthwardMark  Boles M.D.   On: 01/06/2017 09:17   Dg Abd Acute W/chest  Result Date: 01/06/2017 CLINICAL DATA:  Cough and congestion.  Abdominal pain. EXAM: DG ABDOMEN ACUTE W/ 1V CHEST COMPARISON:  CT abdomen and pelvis September 30, 2016 FINDINGS: Cardiomediastinal silhouette is normal. Lungs are clear, no pleural effusions. No pneumothorax. Soft tissue planes and included osseous structures are unremarkable. Bowel gas pattern is nondilated and nonobstructive. Faint density within the stomach and proximal duodenum seen with contrast and, milk of magnesia. No intra-abdominal mass effect, pathologic calcifications or free air. Soft tissue planes and included osseous structures are non-suspicious. IMPRESSION: Normal chest. Normal bowel gas pattern. Electronically Signed   By: Awilda Metroourtnay  Bloomer  M.D.   On: 01/06/2017 05:22     Nausea vomiting and diarrhea  Sepsis, due to unspecified organism (HCC)  Tachycardia  Generalized abdominal pain  Leukocytosis, unspecified type     Everlene FarrierDansie, Soriah Leeman, PA-C 01/06/17 1003    Cardama, Amadeo GarnetPedro Eduardo, MD 01/06/17 [id number]

## 2017-01-06 NOTE — ED Notes (Signed)
Patient resting comfortably. No complaints at this time. Iv fluids infusing. Waiting on inpatient bed assignment.

## 2017-01-06 NOTE — Discharge Instructions (Signed)
1. Medications: zofran, usual home medications °2. Treatment: rest, drink plenty of fluids, advance diet slowly °3. Follow Up: Please followup with your primary doctor in 2 days for discussion of your diagnoses and further evaluation after today's visit; if you do not have a primary care doctor use the resource guide provided to find one; Please return to the ER for persistent vomiting, high fevers or worsening symptoms ° °

## 2017-01-06 NOTE — ED Notes (Signed)
Pt reports nausea, vomiting, and diarrhea that started today. Pt reports vomiting 4 times.

## 2017-01-06 NOTE — Progress Notes (Signed)
AT bedside to place PIV per request for code sepsis.  Pt not in room.  RN to notify IV Team once pt returns by placing IV Team consult.

## 2017-01-06 NOTE — ED Triage Notes (Addendum)
Patient reports multiple emesis with generalized abdominal pain / diarrhea x1 last night , denies fever or chills , mild body aches . Tachycardic at triage .

## 2017-01-06 NOTE — ED Provider Notes (Signed)
[facility] EM[facility]Y DEPARTMENT Provider Note   CSN: [id number] Arrival date & time: 01/06/17  0345     History   Chief Complaint Chief Complaint  Patient presents with  . Abdominal Pain  . Emesis    HPI Kristi Jacobson is a 31 y.o. female with a hx of asthma, celiac disease, menorrhagia, diabetes (on metformin) presents to the Emergency Department complaining of gradual, persistent, progressively worsening generalized abd cramping onset 4 hours ago.  Reports that she has vomited 3 times in the last 4 hours.  Emesis was nonbloody and nonbilious.  She reports one bout of loose stools.  Patient reports last week she had URI symptoms and has had a persistent cough but her fevers have improved.  No other recent illnesses.  Patient reports no additional symptoms.  She is concerned about her blood sugar because she takes metformin and has eaten only a small amount of food today.  She denies headache, neck pain, chest pain, shortness of breath, melena, hematochezia, dysuria, hematuria, weakness, dizziness, syncope.  The history is provided by the patient and medical records. No language interpreter was used.    Past Medical History:  Diagnosis Date  . Asthma   . Celiac disease   . Menorrhagia     Patient Active Problem List   Diagnosis Date Noted  . Asthma   . Menorrhagia   . Celiac disease     Past Surgical History:  Procedure Laterality Date  . WISDOM TOOTH EXTRACTION      OB History    Gravida Para Term Preterm AB Living   0 0 0 0 0 0   SAB TAB Ectopic Multiple Live Births   0 0 0 0 0       Home Medications    Prior to Admission medications   Medication Sig Start Date End Date Taking? Authorizing Provider  metFORMIN (GLUCOPHAGE-XR) 500 MG 24 hr tablet Take 1 tablet (500 mg total) by mouth daily with breakfast. 12/06/16  Yes Renato Shin, MD  Multiple Vitamin (MULTIVITAMIN WITH MINERALS) TABS tablet Take 1 tablet by mouth daily.   Yes  [provider]  Blood Glucose Monitoring Suppl (ONETOUCH VERIO IQ SYSTEM) w/Device KIT Use to check blood sugar 1 time per day. 12/30/15   Renato Shin, MD  drospirenone-ethinyl estradiol (YAZ) 3-0.02 MG tablet Take 1 tablet daily by mouth. May use continuously Patient not taking: Reported on 01/06/2017 12/15/16   Huel Cote, NP  ibuprofen (ADVIL,MOTRIN) 800 MG tablet Take 1 tablet (800 mg total) by mouth 3 (three) times daily. Patient not taking: Reported on 01/06/2017 09/30/16   Orpah Greek, MD  ondansetron (ZOFRAN ODT) 4 MG disintegrating tablet 43m ODT q4 hours prn nausea/vomit 01/06/17   Janay Canan, HJarrett Soho PA-C  ONE TOUCH ULTRA TEST test strip TEST WITH 1 STRIP DAILY. 11/22/16   GPhilemon Kingdom MD    Family History Family History  Problem Relation Age of Onset  . Diabetes Brother 367 . Breast cancer Paternal Grandmother     Social History Social History   Tobacco Use  . Smoking status: Never Smoker  . Smokeless tobacco: Never Used  Substance Use Topics  . Alcohol use: No  . Drug use: Not on file     Allergies   Cephalosporins and Penicillins   Review of Systems Review of Systems  Constitutional: Negative for appetite change, diaphoresis, fatigue, fever and unexpected weight change.  HENT: Negative for mouth sores.   Eyes: Negative for visual disturbance.  Respiratory: Positive for cough. Negative for chest tightness, shortness of breath and wheezing.   Cardiovascular: Negative for chest pain.  Gastrointestinal: Positive for abdominal pain and vomiting. Negative for constipation, diarrhea and nausea.  Endocrine: Negative for polydipsia, polyphagia and polyuria.  Genitourinary: Negative for dysuria, frequency, hematuria and urgency.  Musculoskeletal: Negative for back pain and neck stiffness.  Skin: Negative for rash.  Allergic/Immunologic: Negative for immunocompromised state.  Neurological: Negative for syncope, light-headedness and  headaches.  Hematological: Does not bruise/bleed easily.  Psychiatric/Behavioral: Negative for sleep disturbance. The patient is nervous/anxious.      Physical Exam Updated Vital Signs BP (!) 142/128   Pulse (!) 146   Temp 98.1 F (36.7 C) (Oral)   Resp 18   LMP 12/30/2016   SpO2 97%   Physical Exam  Constitutional: She appears well-developed and well-nourished. No distress.  Awake, alert, nontoxic appearance  HENT:  Head: Normocephalic and atraumatic.  Mouth/Throat: Oropharynx is clear and moist. No oropharyngeal exudate.  Eyes: Conjunctivae are normal. No scleral icterus.  Neck: Normal range of motion. Neck supple.  Cardiovascular: Regular rhythm and intact distal pulses. Tachycardia present.  Pulses:      Radial pulses are 2+ on the right side, and 2+ on the left side.  Pulmonary/Chest: Effort normal and breath sounds normal. No respiratory distress. She has no wheezes.  Equal chest expansion  Abdominal: Soft. Bowel sounds are normal. She exhibits no mass. There is no tenderness. There is no rebound and no guarding.  Musculoskeletal: Normal range of motion. She exhibits no edema.  Neurological: She is alert.  Speech is clear and goal oriented Moves extremities without ataxia  Skin: Skin is warm and dry. She is not diaphoretic.  Psychiatric: Her mood appears anxious.  Patient is clearly anxious  Nursing note and vitals reviewed.    ED Treatments / Results  Labs (all labs ordered are listed, but only abnormal results are displayed) Labs Reviewed  COMPREHENSIVE METABOLIC PANEL - Abnormal; Notable for the following components:      Result Value   CO2 21 (*)    Glucose, Bld 153 (*)    All other components within normal limits  CBC WITH DIFFERENTIAL/PLATELET - Abnormal; Notable for the following components:   WBC 22.5 (*)    RBC 5.26 (*)    MCV 76.0 (*)    MCH 25.1 (*)    Neutro Abs 20.5 (*)    All other components within normal limits  I-STAT CG4 LACTIC ACID, ED  - Abnormal; Notable for the following components:   Lactic Acid, Venous 3.09 (*)    All other components within normal limits  CBG MONITORING, ED - Abnormal; Notable for the following components:   Glucose-Capillary 180 (*)    All other components within normal limits  LIPASE, BLOOD  URINALYSIS, ROUTINE W REFLEX MICROSCOPIC  I-STAT BETA HCG BLOOD, ED ([facility], [facility], [facility] ONLY)  I-STAT CG4 LACTIC ACID, ED    EKG  EKG Interpretation  Date/Time:  Thursday January 06 2017 03:52:29 EST Ventricular Rate:  146 PR Interval:  130 QRS Duration: 70 QT Interval:  280 QTC Calculation: 436 R Axis:   81 Text Interpretation:  Sinus tachycardia T wave abnormality, consider inferior ischemia Abnormal ECG No previous ECGs available Confirmed by Ripley Fraise [id number]) on 01/06/2017 4:05:05 AM       Radiology Dg Abd Acute W/chest  Result Date: 01/06/2017 CLINICAL DATA:  Cough and congestion.  Abdominal pain. EXAM: DG ABDOMEN ACUTE W/ 1V CHEST COMPARISON:  CT abdomen and pelvis September 30, 2016 FINDINGS: Cardiomediastinal silhouette is normal. Lungs are clear, no pleural effusions. No pneumothorax. Soft tissue planes and included osseous structures are unremarkable. Bowel gas pattern is nondilated and nonobstructive. Faint density within the stomach and proximal duodenum seen with contrast and, milk of magnesia. No intra-abdominal mass effect, pathologic calcifications or free air. Soft tissue planes and included osseous structures are non-suspicious. IMPRESSION: Normal chest. Normal bowel gas pattern. Electronically Signed   By: Elon Alas M.D.   On: 01/06/2017 05:22    Procedures Procedures (including critical care time)  Medications Ordered in ED Medications  ondansetron (ZOFRAN) injection 4 mg (4 mg Intravenous Given 01/06/17 0531)  sodium chloride 0.9 % bolus 1,000 mL (1,000 mLs Intravenous New Bag/Given 01/06/17 0532)  sodium chloride 0.9 % bolus 1,000 mL (1,000 mLs Intravenous New Bag/Given  01/06/17 0531)     Initial Impression / Assessment and Plan / ED Course  I have reviewed the triage vital signs and the nursing notes.  Pertinent labs & imaging results that were available during my care of the patient were reviewed by me and considered in my medical decision making (see chart for details).     Patient with symptoms consistent with gastritis and dehydration.  Likely viral in nature.  Vitals are stable, no fever.  Pt with tachycardia, elevated lactic and elevated white blood cell count.  Abdomen is soft and nontender.  Patient is nontoxic, nonseptic appearing, in no apparent distress.  Lungs are clear.  No focal abdominal pain, no peritoneal signs, no concern for appendicitis, cholecystitis, pancreatitis, ruptured viscus, UTI, kidney stone, PID, ectopic pregnancy.  Suspect labs are viral and dehydration in nature.  Pt is well appearing.  At shift change care was transferred to [facility], PA-C who will follow reassess vitals, PO trial and determine disposition.     Final Clinical Impressions(s) / ED Diagnoses   Final diagnoses:  Nausea vomiting and diarrhea    ED Discharge Orders        Ordered    ondansetron (ZOFRAN ODT) 4 MG disintegrating tablet     01/06/17 0619       Kiearra Oyervides, Jarrett Soho, PA-C 01/06/17 2111    Ripley Fraise, MD 01/06/17 [phone]

## 2017-01-06 NOTE — Progress Notes (Signed)
1805 Received patient from ED. Patient is resting comfortably in bed.

## 2017-01-06 NOTE — Progress Notes (Signed)
Pharmacy Antibiotic Note  Kristi Jacobson is a 31 y.o. female admitted on 01/06/2017 with intra-abdominal infection.  Pharmacy has been consulted for levaquin and flagyl dosing. Pt is afebrile but WBC is elevated at 22.5. SCr is WNL and lactic acid is elevated at 3.45.   Plan: Levaquin 750mg  IV Q24H Flagyl 500mg  IV Q8H F/u renal fxn, C&S, clinical status     Temp (24hrs), Avg:99 F (37.2 C), Min:98.1 F (36.7 C), Max:99.8 F (37.7 C)  Recent Labs  Lab 01/06/17 0406 01/06/17 0441 01/06/17 0753  WBC 22.5*  --   --   CREATININE 0.66  --   --   LATICACIDVEN  --  3.09* 3.45*    CrCl cannot be calculated (Unknown ideal weight.).    Allergies  Allergen Reactions  . Cephalosporins Anaphylaxis  . Penicillins Anaphylaxis    Antimicrobials this admission: Levaquin 11/29>> Flagyl 11/29>>  Dose adjustments this admission: N/A  Microbiology results: Pending  Thank you for allowing pharmacy to be a part of this patient's care.  Mayling Aber, Drake LeachRachel Lynn 01/06/2017 8:11 AM

## 2017-01-07 DIAGNOSIS — E119 Type 2 diabetes mellitus without complications: Secondary | ICD-10-CM | POA: Diagnosis not present

## 2017-01-07 DIAGNOSIS — R110 Nausea: Secondary | ICD-10-CM

## 2017-01-07 DIAGNOSIS — E669 Obesity, unspecified: Secondary | ICD-10-CM | POA: Diagnosis not present

## 2017-01-07 DIAGNOSIS — K900 Celiac disease: Secondary | ICD-10-CM

## 2017-01-07 DIAGNOSIS — Z881 Allergy status to other antibiotic agents status: Secondary | ICD-10-CM | POA: Diagnosis not present

## 2017-01-07 DIAGNOSIS — A09 Infectious gastroenteritis and colitis, unspecified: Secondary | ICD-10-CM | POA: Diagnosis not present

## 2017-01-07 DIAGNOSIS — Z880 Allergy status to penicillin: Secondary | ICD-10-CM | POA: Diagnosis not present

## 2017-01-07 DIAGNOSIS — A084 Viral intestinal infection, unspecified: Secondary | ICD-10-CM | POA: Diagnosis not present

## 2017-01-07 LAB — BASIC METABOLIC PANEL
ANION GAP: 6 (ref 5–15)
CHLORIDE: 108 mmol/L (ref 101–111)
CO2: 22 mmol/L (ref 22–32)
Calcium: 7.7 mg/dL — ABNORMAL LOW (ref 8.9–10.3)
Creatinine, Ser: 0.5 mg/dL (ref 0.44–1.00)
GFR calc Af Amer: >60 mL/min (ref >60)
GLUCOSE: 120 mg/dL — AB (ref 65–99)
POTASSIUM: 3.4 mmol/L — AB (ref 3.5–5.1)
Sodium: 136 mmol/L (ref 135–145)

## 2017-01-07 LAB — CBC
HEMATOCRIT: 33.9 % — AB (ref 36.0–46.0)
HEMOGLOBIN: 10.8 g/dL — AB (ref 12.0–15.0)
MCH: 24.7 pg — AB (ref 26.0–34.0)
MCHC: 31.9 g/dL (ref 30.0–36.0)
MCV: 77.4 fL — AB (ref 78.0–100.0)
Platelets: 194 10*3/uL (ref 150–400)
RBC: 4.38 MIL/uL (ref 3.87–5.11)
RDW: 14.4 % (ref 11.5–15.5)
WBC: 8.3 10*3/uL (ref 4.0–10.5)

## 2017-01-07 LAB — GLUCOSE, CAPILLARY
GLUCOSE-CAPILLARY: 78 mg/dL (ref 65–99)
GLUCOSE-CAPILLARY: 93 mg/dL (ref 65–99)
GLUCOSE-CAPILLARY: 83 mg/dL (ref 65–99)
Glucose-Capillary: 98 mg/dL (ref 65–99)

## 2017-01-07 LAB — GASTROINTESTINAL PANEL BY PCR, STOOL (REPLACES STOOL CULTURE)
ADENOVIRUS F40/41: NOT DETECTED
ASTROVIRUS: NOT DETECTED
CAMPYLOBACTER SPECIES: NOT DETECTED
CRYPTOSPORIDIUM: NOT DETECTED
Cyclospora cayetanensis: NOT DETECTED
ENTEROPATHOGENIC E COLI (EPEC): NOT DETECTED
ENTEROTOXIGENIC E COLI (ETEC): NOT DETECTED
Entamoeba histolytica: NOT DETECTED
Enteroaggregative E coli (EAEC): NOT DETECTED
Giardia lamblia: NOT DETECTED
NOROVIRUS GI/GII: DETECTED — AB
PLESIMONAS SHIGELLOIDES: NOT DETECTED
ROTAVIRUS A: NOT DETECTED
Salmonella species: NOT DETECTED
Sapovirus (I, II, IV, and V): NOT DETECTED
Shiga like toxin producing E coli (STEC): NOT DETECTED
Shigella/Enteroinvasive E coli (EIEC): NOT DETECTED
Vibrio cholerae: NOT DETECTED
Vibrio species: NOT DETECTED
YERSINIA ENTEROCOLITICA: NOT DETECTED

## 2017-01-07 LAB — HIV ANTIBODY (ROUTINE TESTING W REFLEX): HIV SCREEN 4TH GENERATION: NONREACTIVE

## 2017-01-07 LAB — PROCALCITONIN: Procalcitonin: <0.1 ng/mL

## 2017-01-07 MED ORDER — DEXTROSE 5 % IV SOLN: 1000.0000 mg | Freq: Once | INTRAVENOUS | Status: DC

## 2017-01-07 MED ORDER — LEVOFLOXACIN IN D5W 750 MG/150ML IV SOLN
750.0000 mg | INTRAVENOUS | Status: DC
  Administered 2017-01-07: 750 mg via INTRAVENOUS
  Filled 2017-01-07: qty 150

## 2017-01-07 NOTE — Progress Notes (Signed)
   Subjective:  Febrile overnight T-max 101.6.  Received 2 doses of IV Levaquin.  This morning she continued to feel nauseous and she also had 3 episodes of diarrhea.  She also complains of diffuse abdominal pain that is cramping in nature.  Objective:  Vital signs in last 24 hours: Vitals:   01/06/17 2053 01/07/17 0438 01/07/17 0500 01/07/17 1340  BP: 114/84 122/70  110/72  Pulse: (!) 130 88  91  Resp: 18 18  18   Temp: 99.3 F (37.4 C) 99.4 F (37.4 C)  99.1 F (37.3 C)  TempSrc:  Oral  Oral  SpO2: 97% 97%  97%  Weight:   217 lb 9.5 oz (98.7 kg)    General: Very pleasant female, obese, well-developed, lying in bed in no acute distress Cardiac: regular rate and rhythm, nl S1/S2, no murmurs, rubs or gallops  Pulm: CTAB, no wheezes or crackles, no increased work of breathing  Abd: soft, NTND, bowel sounds present  Neuro: A&Ox3, able to move all 4 extremities with no focal deficits noted Ext: warm and well perfused, no peripheral edema  Assessment/Plan:  31 year old female with Celiac's disease, T2DM, and obesity who presents with gastroenteritis and lactic acidosis.  # Infectious diarrhea: Patient had 3 episodes of nonbloody diarrhea this morning and continues to be nauseous. States  Febrile overnight.  She received 2 doses of IV Levaquin.  Has remained afebrile throughout the day. Appears euvolemic on exam. Will continue supportive care as below.  GI pathogen panel pending. C diif ordered.  - Zofran prn - f/u GI path panel and C diff  - Advance diet as tolerated  # Lactic Acidosis: Resolved  - Holding metformin  # Celiac disease:Patient with reported Celiac's disease that was unofficially diagnosed by improvement in GI symptoms after she excluded gluten from her diet. She is confident she avoided gluten products prior to her current symptoms.  - Diet as above    Dispo: Anticipated discharge in approximately 1-2 day(s).   Burna CashSantos-Sanchez, Massiah Minjares, MD 01/07/2017, 5:13  PM Pager: [phone]

## 2017-01-07 NOTE — Progress Notes (Signed)
Received a call from [facility] and specimen sent last night was + norovirus. Md made aware.

## 2017-01-08 DIAGNOSIS — A084 Viral intestinal infection, unspecified: Secondary | ICD-10-CM | POA: Diagnosis not present

## 2017-01-08 DIAGNOSIS — Z881 Allergy status to other antibiotic agents status: Secondary | ICD-10-CM | POA: Diagnosis not present

## 2017-01-08 DIAGNOSIS — Z880 Allergy status to penicillin: Secondary | ICD-10-CM | POA: Diagnosis not present

## 2017-01-08 LAB — GLUCOSE, CAPILLARY
GLUCOSE-CAPILLARY: 98 mg/dL (ref 65–99)
Glucose-Capillary: 88 mg/dL (ref 65–99)
Glucose-Capillary: 87 mg/dL (ref 65–99)

## 2017-01-08 LAB — PROCALCITONIN: Procalcitonin: <0.1 ng/mL

## 2017-01-08 LAB — C DIFFICILE QUICK SCREEN W PCR REFLEX
C DIFFICILE (CDIFF) INTERP: NOT DETECTED
C DIFFICILE (CDIFF) TOXIN: NEGATIVE
C DIFFICLE (CDIFF) ANTIGEN: NEGATIVE

## 2017-01-08 MED ORDER — ALBUTEROL SULFATE (2.5 MG/3ML) 0.083% IN NEBU: 3.0000 mL | INHALATION_SOLUTION | RESPIRATORY_TRACT | Status: DC | PRN

## 2017-01-08 NOTE — Progress Notes (Signed)
   Subjective:  Patient denied diarrhea or vomiting overnight.  She reports she is tolerating liquids well.  She has started taking in some solids.  She states that her symptoms have improved during admission.  She stated she wanted to go home today.  Objective:  Vital signs in last 24 hours: Vitals:   01/07/17 0500 01/07/17 1340 01/07/17 2149 01/08/17 0449  BP:  110/72 118/77 106/73  Pulse:  91 (!) 52 (!) 58  Resp:  18 16 17   Temp:  99.1 F (37.3 C) 98.3 F (36.8 C) 98.4 F (36.9 C)  TempSrc:  Oral Oral   SpO2:  97% 100% 99%  Weight: 217 lb 9.5 oz (98.7 kg)   216 lb 11.4 oz (98.3 kg)   General: Very pleasant female, obese, well-developed, lying in bed in no acute distress Cardiac: regular rate and rhythm, nl S1/S2, no murmurs, rubs or gallops  Pulm: CTAB, no wheezes or crackles, no increased work of breathing  Abd: soft, NTND, bowel sounds present  Ext: warm and well perfused, no peripheral edema  Assessment/Plan:  31 year old female with Celiac's disease, T2DM, and obesity who presents with gastroenteritis and lactic acidosis.  # Infectious diarrhea: GI panel reported norovirus present.  Will stop antibiotics.  Today patient states she is much improved and wants to go home.  - Zofran prn - f/u C diff - not collected, no need at this time  - Dc to home  # Lactic Acidosis: Resolved  - Holding metformin  # Celiac disease:Patient with reported Celiac's disease that was unofficially diagnosed by improvement in GI symptoms after she excluded gluten from her diet.  - Gluten free diet, advance diet as tolerated   Dispo: Anticipated discharge today.   Geralyn CorwinHoffman, Doneisha Ivey [place], DO 01/08/2017, 8:16 AM Pager: [phone]

## 2017-01-08 NOTE — Discharge Summary (Signed)
Name: Kristi Jacobson MRN: [id number] DOB: [birth year] 31 y.o. PCP: Princess Bruins, MD  Date of Admission: 01/06/2017  3:55 AM Date of Discharge: 01/08/2017 Attending Physician: Gilles Chiquito, MD  Discharge Diagnosis: 1. Viral Gastroenteritis   Principal Problem:   Gastroenteritis Active Problems:   Celiac disease   T2DM (type 2 diabetes mellitus) ([place])   Discharge Medications: Allergies as of 01/08/2017      Reactions   Cephalosporins Anaphylaxis   Penicillins Anaphylaxis      Medication List    TAKE these medications   drospirenone-ethinyl estradiol 3-0.02 MG tablet Commonly known as:  YAZ Take 1 tablet daily by mouth. May use continuously   ibuprofen 800 MG tablet Commonly known as:  ADVIL,MOTRIN Take 1 tablet (800 mg total) by mouth 3 (three) times daily.   metFORMIN 500 MG 24 hr tablet Commonly known as:  GLUCOPHAGE-XR Take 1 tablet (500 mg total) by mouth daily with breakfast.   multivitamin with minerals Tabs tablet Take 1 tablet by mouth daily.   ONE TOUCH ULTRA TEST test strip Generic drug:  glucose blood TEST WITH 1 STRIP DAILY. Notes to patient:  Check your cbg before meals   ONETOUCH VERIO IQ SYSTEM w/Device Kit Use to check blood sugar 1 time per day. Notes to patient:  Check your cbg before meals       Disposition and follow-up:   Kristi Jacobson was discharged from [facility] in good condition.  At the hospital follow up visit please address:  1.  GI symptoms  2.  Labs / imaging needed at time of follow-up: none  3.  Pending labs/ test needing follow-up: none  Follow-up Appointments: Follow-up Information    Princess Bruins, MD. Schedule an appointment as soon as possible for a visit in 1 week.   Specialty:  Obstetrics and Gynecology Why:  For further evaluation Contact information: [address] [place] [address] [phone]        Go to  [facility].   Specialty:  Emergency Medicine Why:  As needed, If symptoms worsen Contact information: [address] [id number] [place] [facility] Course by problem list: Principal Problem:   Gastroenteritis Active Problems:   Celiac disease   T2DM (type 2 diabetes mellitus) ([place])   1. Viral gastroenteritis Patient presented with progressive nausea, vomiting, and diarrhea for 1 day prior to admission.  Patient had an elevated lactic acid and white blood cell count that resolved during admission.  She was started initially on antibiotics for gastroenteritis until GI pane revealed norovirus.  C.difficile antigen and toxin was negative. She was also treated with supportive care including IV hydration and nausea medication.  She was tolerating oral intake with fluids prior to discharge.    Discharge Vitals:   BP 117/67 (BP Location: Right Arm)   Pulse 76   Temp 98.2 F (36.8 C) (Oral)   Resp 16   Wt 216 lb 11.4 oz (98.3 kg)   LMP 12/30/2016 Comment: neg preg test  SpO2 100%   BMI 35.24 kg/m   Pertinent Labs, Studies, and Procedures:  GI path panel   Discharge Instructions: Discharge Instructions    Diet - low sodium heart healthy   Complete by:  As directed    Discharge instructions   Complete by:  As directed    Kristi Jacobson,  You were admitted for gastroenteritis.  Please  follow up with you primary care doctor after discharge.   Increase activity slowly   Complete by:  As directed       Signed: Valinda Party, DO 01/11/2017, 6:16 PM   Pager: [phone]

## 2017-01-08 NOTE — Progress Notes (Signed)
Pt for discharge going home, discontinue peripheral IV, health teachings, next appointment, v/s stable , no s/s of distress, given all her personal belongings.

## 2017-01-11 LAB — CULTURE, BLOOD (ROUTINE X 2)
CULTURE: NO GROWTH
Culture: NO GROWTH
Special Requests: ADEQUATE
Special Requests: ADEQUATE

## 2017-01-18 ENCOUNTER — Encounter (HOSPITAL_COMMUNITY): Payer: Self-pay

## 2017-01-18 ENCOUNTER — Inpatient Hospital Stay (HOSPITAL_COMMUNITY)
Admission: EM | Admit: 2017-01-18 | Discharge: 2017-01-21 | DRG: 392 | Disposition: A | Discharge status: Home / Self Care | Payer: BLUE CROSS/BLUE SHIELD | Attending: Internal Medicine | Admitting: Internal Medicine

## 2017-01-18 ENCOUNTER — Other Ambulatory Visit: Payer: Self-pay

## 2017-01-18 DIAGNOSIS — K529 Noninfective gastroenteritis and colitis, unspecified: Secondary | ICD-10-CM | POA: Diagnosis present

## 2017-01-18 DIAGNOSIS — E119 Type 2 diabetes mellitus without complications: Secondary | ICD-10-CM | POA: Diagnosis present

## 2017-01-18 DIAGNOSIS — E669 Obesity, unspecified: Secondary | ICD-10-CM | POA: Diagnosis present

## 2017-01-18 DIAGNOSIS — R112 Nausea with vomiting, unspecified: Secondary | ICD-10-CM | POA: Diagnosis not present

## 2017-01-18 DIAGNOSIS — J45909 Unspecified asthma, uncomplicated: Secondary | ICD-10-CM | POA: Diagnosis present

## 2017-01-18 DIAGNOSIS — R05 Cough: Secondary | ICD-10-CM

## 2017-01-18 DIAGNOSIS — K900 Celiac disease: Secondary | ICD-10-CM | POA: Diagnosis present

## 2017-01-18 DIAGNOSIS — A0832 Astrovirus enteritis: Principal | ICD-10-CM | POA: Diagnosis present

## 2017-01-18 DIAGNOSIS — Z6834 Body mass index (BMI) 34.0-34.9, adult: Secondary | ICD-10-CM

## 2017-01-18 DIAGNOSIS — Z833 Family history of diabetes mellitus: Secondary | ICD-10-CM

## 2017-01-18 DIAGNOSIS — Z7984 Long term (current) use of oral hypoglycemic drugs: Secondary | ICD-10-CM

## 2017-01-18 DIAGNOSIS — E872 Acidosis: Secondary | ICD-10-CM | POA: Diagnosis present

## 2017-01-18 DIAGNOSIS — R059 Cough, unspecified: Secondary | ICD-10-CM

## 2017-01-18 DIAGNOSIS — R197 Diarrhea, unspecified: Secondary | ICD-10-CM

## 2017-01-18 LAB — I-STAT CG4 LACTIC ACID, ED
Lactic Acid, Venous: 1.97 mmol/L — ABNORMAL HIGH (ref 0.5–1.9)
Lactic Acid, Venous: 0.91 mmol/L (ref 0.5–1.9)

## 2017-01-18 LAB — URINALYSIS, ROUTINE W REFLEX MICROSCOPIC
Bilirubin Urine: NEGATIVE
Glucose, UA: NEGATIVE mg/dL
Hgb urine dipstick: NEGATIVE
Ketones, ur: NEGATIVE mg/dL
Leukocytes, UA: NEGATIVE
Nitrite: NEGATIVE
Protein, ur: NEGATIVE mg/dL
Specific Gravity, Urine: 1.021 (ref 1.005–1.030)
pH: 7 (ref 5.0–8.0)

## 2017-01-18 LAB — CBC
HCT: 40 % (ref 36.0–46.0)
Hemoglobin: 13.3 g/dL (ref 12.0–15.0)
MCH: 25.6 pg — ABNORMAL LOW (ref 26.0–34.0)
MCHC: 33.3 g/dL (ref 30.0–36.0)
MCV: 77.1 fL — ABNORMAL LOW (ref 78.0–100.0)
Platelets: 283 10*3/uL (ref 150–400)
RBC: 5.19 MIL/uL — ABNORMAL HIGH (ref 3.87–5.11)
RDW: 14.3 % (ref 11.5–15.5)
WBC: 17.9 10*3/uL — ABNORMAL HIGH (ref 4.0–10.5)

## 2017-01-18 LAB — COMPREHENSIVE METABOLIC PANEL
ALT: 27 U/L (ref 14–54)
AST: 22 U/L (ref 15–41)
Albumin: 4.1 g/dL (ref 3.5–5.0)
Alkaline Phosphatase: 61 U/L (ref 38–126)
Anion gap: 10 (ref 5–15)
BUN: 7 mg/dL (ref 6–20)
CO2: 21 mmol/L — ABNORMAL LOW (ref 22–32)
Calcium: 9.3 mg/dL (ref 8.9–10.3)
Chloride: 101 mmol/L (ref 101–111)
Creatinine, Ser: 0.64 mg/dL (ref 0.44–1.00)
GFR calc Af Amer: >60 mL/min (ref >60)
GFR calc non Af Amer: >60 mL/min (ref >60)
Glucose, Bld: 122 mg/dL — ABNORMAL HIGH (ref 65–99)
Potassium: 4 mmol/L (ref 3.5–5.1)
Sodium: 132 mmol/L — ABNORMAL LOW (ref 135–145)
Total Bilirubin: 0.4 mg/dL (ref 0.3–1.2)
Total Protein: 7.8 g/dL (ref 6.5–8.1)

## 2017-01-18 LAB — I-STAT BETA HCG BLOOD, ED (MC, WL, AP ONLY): I-stat hCG, quantitative: <5 m[IU]/mL (ref <5)

## 2017-01-18 LAB — LIPASE, BLOOD: Lipase: 28 U/L (ref 11–51)

## 2017-01-18 LAB — CBG MONITORING, ED: GLUCOSE-CAPILLARY: 117 mg/dL — AB (ref 65–99)

## 2017-01-18 MED ORDER — PROMETHAZINE HCL 25 MG/ML IJ SOLN
12.5000 mg | Freq: Once | INTRAMUSCULAR | Status: AC
  Administered 2017-01-18: 12.5 mg via INTRAVENOUS
  Filled 2017-01-18: qty 1

## 2017-01-18 MED ORDER — ONDANSETRON HCL 4 MG/2ML IJ SOLN
4.0000 mg | Freq: Once | INTRAMUSCULAR | Status: AC
  Administered 2017-01-19: 4 mg via INTRAVENOUS
  Filled 2017-01-18: qty 2

## 2017-01-18 MED ORDER — LOPERAMIDE HCL 2 MG PO CAPS
4.0000 mg | ORAL_CAPSULE | Freq: Once | ORAL | Status: AC
  Administered 2017-01-18: 4 mg via ORAL
  Filled 2017-01-18: qty 2

## 2017-01-18 MED ORDER — SODIUM CHLORIDE 0.9 % IV BOLUS (SEPSIS)
1000.0000 mL | Freq: Once | INTRAVENOUS | Status: AC
  Administered 2017-01-18: 1000 mL via INTRAVENOUS

## 2017-01-18 NOTE — ED Triage Notes (Signed)
Pt reports generalized abdominal pain, nausea, weakness, diarrhea, tachycardia since last night. She reports she was d/c from inpatient for all the same symptoms on 12/1 and dx with noro virus and instructed to return if symptoms persist. A&Ox 4 NAD

## 2017-01-19 DIAGNOSIS — J45909 Unspecified asthma, uncomplicated: Secondary | ICD-10-CM | POA: Diagnosis present

## 2017-01-19 DIAGNOSIS — Z7984 Long term (current) use of oral hypoglycemic drugs: Secondary | ICD-10-CM

## 2017-01-19 DIAGNOSIS — E119 Type 2 diabetes mellitus without complications: Secondary | ICD-10-CM | POA: Diagnosis present

## 2017-01-19 DIAGNOSIS — Z8719 Personal history of other diseases of the digestive system: Secondary | ICD-10-CM | POA: Diagnosis not present

## 2017-01-19 DIAGNOSIS — Z880 Allergy status to penicillin: Secondary | ICD-10-CM | POA: Diagnosis not present

## 2017-01-19 DIAGNOSIS — Z881 Allergy status to other antibiotic agents status: Secondary | ICD-10-CM

## 2017-01-19 DIAGNOSIS — E669 Obesity, unspecified: Secondary | ICD-10-CM | POA: Diagnosis present

## 2017-01-19 DIAGNOSIS — E872 Acidosis: Secondary | ICD-10-CM | POA: Diagnosis present

## 2017-01-19 DIAGNOSIS — A0839 Other viral enteritis: Secondary | ICD-10-CM | POA: Diagnosis not present

## 2017-01-19 DIAGNOSIS — Z888 Allergy status to other drugs, medicaments and biological substances status: Secondary | ICD-10-CM

## 2017-01-19 DIAGNOSIS — R112 Nausea with vomiting, unspecified: Secondary | ICD-10-CM | POA: Diagnosis present

## 2017-01-19 DIAGNOSIS — Z6834 Body mass index (BMI) 34.0-34.9, adult: Secondary | ICD-10-CM | POA: Diagnosis not present

## 2017-01-19 DIAGNOSIS — K900 Celiac disease: Secondary | ICD-10-CM | POA: Diagnosis present

## 2017-01-19 DIAGNOSIS — Z91018 Allergy to other foods: Secondary | ICD-10-CM | POA: Diagnosis not present

## 2017-01-19 DIAGNOSIS — A0832 Astrovirus enteritis: Secondary | ICD-10-CM | POA: Diagnosis present

## 2017-01-19 DIAGNOSIS — Z833 Family history of diabetes mellitus: Secondary | ICD-10-CM | POA: Diagnosis not present

## 2017-01-19 LAB — GASTROINTESTINAL PANEL BY PCR, STOOL (REPLACES STOOL CULTURE)
ADENOVIRUS F40/41: NOT DETECTED
Astrovirus: DETECTED — AB
CRYPTOSPORIDIUM: NOT DETECTED
Campylobacter species: NOT DETECTED
Cyclospora cayetanensis: NOT DETECTED
ENTAMOEBA HISTOLYTICA: NOT DETECTED
ENTEROTOXIGENIC E COLI (ETEC): NOT DETECTED
Enteroaggregative E coli (EAEC): NOT DETECTED
Enteropathogenic E coli (EPEC): NOT DETECTED
GIARDIA LAMBLIA: NOT DETECTED
Norovirus GI/GII: NOT DETECTED
Plesimonas shigelloides: NOT DETECTED
ROTAVIRUS A: NOT DETECTED
SALMONELLA SPECIES: NOT DETECTED
Sapovirus (I, II, IV, and V): NOT DETECTED
Shiga like toxin producing E coli (STEC): NOT DETECTED
Shigella/Enteroinvasive E coli (EIEC): NOT DETECTED
Vibrio cholerae: NOT DETECTED
Vibrio species: NOT DETECTED
YERSINIA ENTEROCOLITICA: NOT DETECTED

## 2017-01-19 LAB — BASIC METABOLIC PANEL
ANION GAP: 7 (ref 5–15)
BUN: 6 mg/dL (ref 6–20)
CHLORIDE: 111 mmol/L (ref 101–111)
CO2: 19 mmol/L — AB (ref 22–32)
Calcium: 7.9 mg/dL — ABNORMAL LOW (ref 8.9–10.3)
Creatinine, Ser: 0.59 mg/dL (ref 0.44–1.00)
GFR calc Af Amer: >60 mL/min (ref >60)
GFR calc non Af Amer: >60 mL/min (ref >60)
GLUCOSE: 112 mg/dL — AB (ref 65–99)
POTASSIUM: 3.7 mmol/L (ref 3.5–5.1)
Sodium: 137 mmol/L (ref 135–145)

## 2017-01-19 LAB — LACTOFERRIN, FECAL, QUALITATIVE: LACTOFERRIN, FECAL, QUAL: POSITIVE — AB

## 2017-01-19 LAB — CBC
HEMATOCRIT: 34.7 % — AB (ref 36.0–46.0)
HEMOGLOBIN: 10.9 g/dL — AB (ref 12.0–15.0)
MCH: 24.7 pg — AB (ref 26.0–34.0)
MCHC: 31.4 g/dL (ref 30.0–36.0)
MCV: 78.7 fL (ref 78.0–100.0)
Platelets: 217 10*3/uL (ref 150–400)
RBC: 4.41 MIL/uL (ref 3.87–5.11)
RDW: 15.3 % (ref 11.5–15.5)
WBC: 8.1 10*3/uL (ref 4.0–10.5)

## 2017-01-19 LAB — RAPID URINE DRUG SCREEN, HOSP PERFORMED
Amphetamines: NOT DETECTED
BARBITURATES: NOT DETECTED
BENZODIAZEPINES: NOT DETECTED
COCAINE: NOT DETECTED
OPIATES: NOT DETECTED
Tetrahydrocannabinol: NOT DETECTED

## 2017-01-19 LAB — DIFFERENTIAL
BASOS ABS: 0 10*3/uL (ref 0.0–0.1)
BASOS PCT: 0 %
EOS ABS: 0 10*3/uL (ref 0.0–0.7)
Eosinophils Relative: 0 %
LYMPHS ABS: 1.2 10*3/uL (ref 0.7–4.0)
Lymphocytes Relative: 14 %
MONO ABS: 0.4 10*3/uL (ref 0.1–1.0)
MONOS PCT: 5 %
Neutro Abs: 6.5 10*3/uL (ref 1.7–7.7)
Neutrophils Relative %: 81 %

## 2017-01-19 LAB — GLUCOSE, CAPILLARY
GLUCOSE-CAPILLARY: 106 mg/dL — AB (ref 65–99)
GLUCOSE-CAPILLARY: 86 mg/dL (ref 65–99)
GLUCOSE-CAPILLARY: 78 mg/dL (ref 65–99)
GLUCOSE-CAPILLARY: 84 mg/dL (ref 65–99)

## 2017-01-19 MED ORDER — ONDANSETRON HCL 4 MG/2ML IJ SOLN
4.0000 mg | Freq: Four times a day (QID) | INTRAMUSCULAR | Status: DC | PRN
  Administered 2017-01-20 (×2): 4 mg via INTRAVENOUS
  Filled 2017-01-19 (×2): qty 2

## 2017-01-19 MED ORDER — ENOXAPARIN SODIUM 40 MG/0.4ML ~~LOC~~ SOLN
40.0000 mg | SUBCUTANEOUS | Status: DC
  Filled 2017-01-19: qty 0.4

## 2017-01-19 MED ORDER — SODIUM CHLORIDE 0.9 % IV SOLN
INTRAVENOUS | Status: DC
  Administered 2017-01-19 – 2017-01-20 (×2): via INTRAVENOUS

## 2017-01-19 MED ORDER — LOPERAMIDE HCL 2 MG PO CAPS
2.0000 mg | ORAL_CAPSULE | ORAL | Status: AC | PRN
  Administered 2017-01-20: 2 mg via ORAL
  Filled 2017-01-19: qty 1

## 2017-01-19 MED ORDER — SIMETHICONE 80 MG PO CHEW
80.0000 mg | CHEWABLE_TABLET | Freq: Four times a day (QID) | ORAL | Status: DC
  Administered 2017-01-19 – 2017-01-21 (×9): 80 mg via ORAL
  Filled 2017-01-19 (×8): qty 1

## 2017-01-19 MED ORDER — HYDROCODONE-ACETAMINOPHEN 5-325 MG PO TABS
1.0000 | ORAL_TABLET | Freq: Four times a day (QID) | ORAL | Status: DC | PRN
  Administered 2017-01-19 – 2017-01-21 (×5): 1 via ORAL
  Filled 2017-01-19 (×5): qty 1

## 2017-01-19 MED ORDER — INSULIN ASPART 100 UNIT/ML ~~LOC~~ SOLN: 0.0000 [IU] | Freq: Three times a day (TID) | SUBCUTANEOUS | Status: DC

## 2017-01-19 MED ORDER — SODIUM CHLORIDE 0.9 % IV SOLN
INTRAVENOUS | Status: AC
  Administered 2017-01-19 (×2): via INTRAVENOUS

## 2017-01-19 NOTE — ED Provider Notes (Signed)
[facility] EMERGENCY DEPARTMENT Provider Note   CSN: [id number] Arrival date & time: 01/18/17  1753     History   Chief Complaint Chief Complaint  Patient presents with  . Nausea  . Tachycardia    HPI Kristi Jacobson is a 31 y.o. female.  HPI   31 year old female with nausea/vomiting/diarrhea.  She was evaluated in the emergency room a couple weeks ago for similar symptoms.  She reports that they largely improved although she has had some continued nausea.  Since last night she had increasing symptoms and vomited multiple times.  Multiple watery stools.  Crampy generalized abdominal pain.  No fevers or chills.  No urinary complaints.  Multiple sick contacts with vomiting/diarrhea at home.  Past Medical History:  Diagnosis Date  . Asthma   . Celiac disease   . Menorrhagia     Patient Active Problem List   Diagnosis Date Noted  . Gastroenteritis 01/06/2017  . T2DM (type 2 diabetes mellitus) ([place]) 01/06/2017  . Asthma   . Menorrhagia   . Celiac disease     Past Surgical History:  Procedure Laterality Date  . WISDOM TOOTH EXTRACTION      OB History    Gravida Para Term Preterm AB Living   0 0 0 0 0 0   SAB TAB Ectopic Multiple Live Births   0 0 0 0 0       Home Medications    Prior to Admission medications   Medication Sig Start Date End Date Taking? Authorizing Provider  metFORMIN (GLUCOPHAGE-XR) 500 MG 24 hr tablet Take 1 tablet (500 mg total) by mouth daily with breakfast. 12/06/16  Yes Renato Shin, MD  Multiple Vitamin (MULTIVITAMIN WITH MINERALS) TABS tablet Take 1 tablet by mouth daily.   Yes [provider]  UNABLE TO FIND Kombucha tea: Drink one to two times a week as needed for probiotic effect   Yes [provider]  Blood Glucose Monitoring Suppl (ONETOUCH VERIO IQ SYSTEM) w/Device KIT Use to check blood sugar 1 time per day. 12/30/15   Renato Shin, MD  drospirenone-ethinyl estradiol (YAZ) 3-0.02 MG  tablet Take 1 tablet daily by mouth. May use continuously Patient not taking: Reported on 01/18/2017 12/15/16   Huel Cote, NP  ibuprofen (ADVIL,MOTRIN) 800 MG tablet Take 1 tablet (800 mg total) by mouth 3 (three) times daily. Patient not taking: Reported on 01/18/2017 09/30/16   Orpah Greek, MD  ONE TOUCH ULTRA TEST test strip TEST WITH 1 STRIP DAILY. 11/22/16   Philemon Kingdom, MD    Family History Family History  Problem Relation Age of Onset  . Diabetes Brother 84  . Breast cancer Paternal Grandmother     Social History Social History   Tobacco Use  . Smoking status: Never Smoker  . Smokeless tobacco: Never Used  Substance Use Topics  . Alcohol use: No  . Drug use: No     Allergies   Cephalosporins; Penicillins; Gluten meal; Nyquil hbp cold & flu [dm-doxylamine-acetaminophen]; Other; and Wheat bran   Review of Systems Review of Systems  All systems reviewed and negative, other than as noted in HPI. Physical Exam Updated Vital Signs BP 113/74   Pulse (!) 122   Temp 98.8 F (37.1 C) (Oral)   Resp 11   LMP 12/30/2016 Comment: neg preg test  SpO2 100%   Physical Exam  Constitutional: She appears well-developed and well-nourished. No distress.  HENT:  Head: Normocephalic and atraumatic.  Eyes: Conjunctivae are  normal. Right eye exhibits no discharge. Left eye exhibits no discharge.  Neck: Neck supple.  Cardiovascular: Regular rhythm and normal heart sounds. Exam reveals no gallop and no friction rub.  No murmur heard. Tachycardic  Pulmonary/Chest: Effort normal and breath sounds normal. No respiratory distress.  Abdominal: Soft. She exhibits no distension. There is no tenderness.  Musculoskeletal: She exhibits no edema or tenderness.  Neurological: She is alert.  Skin: Skin is warm and dry.  Psychiatric: She has a normal mood and affect. Her behavior is normal. Thought content normal.  Nursing note and vitals reviewed.    ED Treatments /  Results  Labs (all labs ordered are listed, but only abnormal results are displayed) Labs Reviewed  COMPREHENSIVE METABOLIC PANEL - Abnormal; Notable for the following components:      Result Value   Sodium 132 (*)    CO2 21 (*)    Glucose, Bld 122 (*)    All other components within normal limits  CBC - Abnormal; Notable for the following components:   WBC 17.9 (*)    RBC 5.19 (*)    MCV 77.1 (*)    MCH 25.6 (*)    All other components within normal limits  CBG MONITORING, ED - Abnormal; Notable for the following components:   Glucose-Capillary 117 (*)    All other components within normal limits  I-STAT CG4 LACTIC ACID, ED - Abnormal; Notable for the following components:   Lactic Acid, Venous 1.97 (*)    All other components within normal limits  CULTURE, BLOOD (ROUTINE X 2)  CULTURE, BLOOD (ROUTINE X 2)  LIPASE, BLOOD  URINALYSIS, ROUTINE W REFLEX MICROSCOPIC  I-STAT BETA HCG BLOOD, ED ([facility], [facility], [facility] ONLY)  I-STAT CG4 LACTIC ACID, ED    EKG  EKG Interpretation  Date/Time:  Tuesday January 18 2017 20:30:55 EST Ventricular Rate:  135 PR Interval:    QRS Duration: 87 QT Interval:  289 QTC Calculation: 434 R Axis:   40 Text Interpretation:  Sinus tachycardia Borderline T abnormalities, diffuse leads Confirmed by Virgel Manifold [phone]) on 01/18/2017 11:33:03 PM       Radiology No results found.  Procedures Procedures (including critical care time)  Medications Ordered in ED Medications  sodium chloride 0.9 % bolus 1,000 mL (0 mLs Intravenous Stopped 01/18/17 2127)    And  sodium chloride 0.9 % bolus 1,000 mL (1,000 mLs Intravenous New Bag/Given 01/18/17 2030)    And  sodium chloride 0.9 % bolus 1,000 mL (0 mLs Intravenous Stopped 01/18/17 2156)  promethazine (PHENERGAN) injection 12.5 mg (12.5 mg Intravenous Given 01/18/17 2133)  loperamide (IMODIUM) capsule 4 mg (4 mg Oral Given 01/18/17 2133)  ondansetron (ZOFRAN) injection 4 mg (4 mg Intravenous Given  01/19/17 0006)     Initial Impression / Assessment and Plan / ED Course  I have reviewed the triage vital signs and the nursing notes.  Pertinent labs & imaging results that were available during my care of the patient were reviewed by me and considered in my medical decision making (see chart for details).     31 year old female with nausea, vomiting diarrhea.  Not feeling better after phenergan and zofran and remains with HR around 120 after 2+ L of IVF. Will admit for symptom management.   Final Clinical Impressions(s) / ED Diagnoses   Final diagnoses:  Nausea vomiting and diarrhea    ED Discharge Orders    None       Virgel Manifold, MD 01/19/17 [phone]

## 2017-01-19 NOTE — H&P (Signed)
Date: 01/19/2017               Patient Name:  Kristi Jacobson MRN: [id number]  DOB: April 21, 1985 Age / Sex: 31 y.o., female   PCP: Genia DelLavoie, Marie-Lyne, MD         Medical Service: Internal Medicine Teaching Service         Attending Physician: Dr. Inez CatalinaMullen, Emily B, MD    First Contact: Dr. Crista ElliotHarbrecht Pager: [phone]  Second Contact: Dr. Mikey BussingHoffman Pager: [phone]       After Hours (After 5p/  First Contact Pager: [phone]  weekends / holidays): Second Contact Pager: [phone]   Chief Complaint: Nausea, Vomiting, Diarrhea  History of Present Illness: Ms. Kristi Jacobson is a 31 yo F with a past medical history of DM, obesity, presumed Celiac disease who presented to the ED with complaints of n/v/d.   Of note, she was recently admitted from 11/29-12/1 with similar symptoms and lactic acidosis, leukocytosis. GI pathogen panel revealed norovirus as a cause for gastroenteritis. She improved with IV hydration and anti-emetics and was discharged home tolerating adequate PO intake.   Since discharge, she has had some ongoing nausea which worsened this afternoon. She tried soup to maintain PO intake and had an episode on non-bloody, non-bilious emesis. She has also had about 4 episodes on non-bloody diarrhea since yesterday. She also notes diffuse aching/bloating abdominal pain that improves with flatulence. She has a reported history of Celiac and denies any known sources of gluten ingestion recently. She has been following a gluten free diet for ~8 years, ensures food are gluten free labeled, eats a lot of vegetables. Her sister-in-law who she has been in contact with had similar symptoms yesterday.   In the ED, afebrile, HR 139, BP 154/128, 99% on RA. Labs revealed WBC 17.9, HCO3 21, LA 1.97 which subsequently improved to 0.91. She received 3 L IVF bolus, anti-emetics, loperamide without significant improvement and was admitted for further management.   Meds:  Current Meds  Medication Sig  .  metFORMIN (GLUCOPHAGE-XR) 500 MG 24 hr tablet Take 1 tablet (500 mg total) by mouth daily with breakfast.  . Multiple Vitamin (MULTIVITAMIN WITH MINERALS) TABS tablet Take 1 tablet by mouth daily.  Marland Kitchen. UNABLE TO FIND Kombucha tea: Drink one to two times a week as needed for probiotic effect     Allergies: Allergies as of 01/18/2017 - Review Complete 01/18/2017  Allergen Reaction Noted  . Cephalosporins Anaphylaxis 04/19/2014  . Penicillins Anaphylaxis 04/19/2014  . Gluten meal Diarrhea, Nausea And Vomiting, and Other (See Comments) 01/18/2017  . Nyquil hbp cold & flu [dm-doxylamine-acetaminophen] Other (See Comments) 01/18/2017  . Other Diarrhea, Nausea And Vomiting, and Other (See Comments) 01/18/2017  . Wheat bran Diarrhea, Nausea And Vomiting, and Other (See Comments) 01/18/2017   Past Medical History:  Diagnosis Date  . Asthma   . Celiac disease   . Menorrhagia     Family History:  Family History  Problem Relation Age of Onset  . Diabetes Brother 30  . Breast cancer Paternal Grandmother      Social History:  Social History   Tobacco Use  . Smoking status: Never Smoker  . Smokeless tobacco: Never Used  Substance Use Topics  . Alcohol use: No  . Drug use: No     Review of Systems: A complete ROS was negative except as per HPI.   Physical Exam: Blood pressure 113/74, pulse (!) 122, temperature 98.8 F (37.1 C), temperature source Oral, resp. rate 11,  last menstrual period 12/30/2016, SpO2 100 %. HR, BP improved compared to presentation General: Resting in bed comfortably, no acute distress Head: Normocephalic, atraumatic Eyes: No scleral icterus, PERRL  ENT: Slightly dry mucus membranes, no exudate   CV: Tachycardic, regular rhythm, no murmur appreciated   Resp: Clear to auscultation bilaterally, normal work of breathing, no distress  Abd: Soft, obese, +BS, minimal tenderness to palpation Extr: No LE edema, good peripheral pulses  Neuro: Alert and oriented x3    Skin: Warm, dry    EKG: personally reviewed my interpretation is sinus tachycardia, QTc 434, no ischemic changes appreciated, similar to prior.   Assessment & Plan by Problem:  Gastroenteritis Presenting with recurrent sx of gastroenteritis following a recent admission with positive norovirus on path panel. There is likely limited utility in repeating a pathogen panel or repeat C diff testing. Imaging on 11/29 also negative for abdominal or pelvic pathology. Will check UDS to assess for component of THC use contributing to recurrent/cyclical GI sx, though she denies use and this may be less likely. She may have also been reinfected by different strain given recent re-exposure to sick contacts. Will treat symptomatically and hold off on antibiotics at this point.   --NPO, advance diet as tolerated  --IVF at 150 cc/hr --Zofran 4 mg q6hr prn, imodium   --Rpt labs in am --Fecal lactoferrin --F/u blood cxs  --UDS     H/o Celiac Disease Pt reports a history of Celiac disease that has been diagnosed based on improved GI symptoms after exclusion of gluten from diet while under care of PCP in [place], no biopsy or endoscopy reported. She again denies gluten exposure as a potential factor for worsening sx. Will continue gluten free diet when sx improve.    H/o DM2 Last A1c 6.2 (11/29), on metformin as an outpatient which may have contributed to lactic acidosis on her prior admission (up to 4.64). No significant acidosis with current presentation, no ketones, bg 122.  --Hold home metformin  --SSI  Dispo: Admit patient to Observation with expected length of stay less than 2 midnights.  Signed: Ginger CarneHarden, Stark Aguinaga, MD 01/19/2017, 1:14 AM  Pager: [phone]

## 2017-01-19 NOTE — Progress Notes (Signed)
Patient admitted to 5w30 from ED, alert/oriented x4. Patient complains of abdominal cramping, tenderness to light touch, and nausea. Started IV fluids per orders. Patient independent in ADLs, low fall risk, oriented to room, call bell and safety precautions. Will continue to monitor.

## 2017-01-19 NOTE — Plan of Care (Signed)
  Progressing Activity: Risk for activity intolerance will decrease 01/19/2017 1215 - Progressing by Angela Nevinharleston, Maronda Caison, RN Nutrition: Adequate nutrition will be maintained 01/19/2017 1215 - Progressing by Angela Nevinharleston, Maycel Riffe, RN Coping: Level of anxiety will decrease 01/19/2017 1215 - Progressing by Angela Nevinharleston, Esraa Seres, RN Pain Managment: General experience of comfort will improve 01/19/2017 1215 - Progressing by Angela Nevinharleston, Jamael Hoffmann, RN

## 2017-01-19 NOTE — ED Notes (Signed)
Patient is stable and ready to be transport to the floor at this time.  Report was called to 5W RN.  Belongings taken with the patient to the floor.   

## 2017-01-19 NOTE — Progress Notes (Signed)
   Subjective: The patient was resting in her bed today for entering the room.  She stated that her diarrhea is slowing that occurs at night, is not better or worse with food but occurs directly after ingestion of food, she does not notice mucus, or blood in her stool.  In addition she denies other concerning symptoms except for mild fever, painful abdominal bloating and distention, and this watery diarrhea this is consistent of clear fluid.  The patient attested to a history of celiac disease confirmed with endoscopy or biopsy.  However, the patient denies any gluten meals.  Objective:  Vital signs in last 24 hours: Vitals:   01/19/17 0155 01/19/17 0208 01/19/17 0231 01/19/17 0543  BP:   110/72 121/77  Pulse:   (!) 131 (!) 112  Resp: 19  18 20   Temp:   99.4 F (37.4 C) 99.9 F (37.7 C)  TempSrc:   Oral Oral  SpO2:   97% 96%  Weight:  199 lb 1.2 oz (90.3 kg)    Height:  5\' 4"  (1.626 m)     ROS negative except as per HPI.  Physical Exam  Constitutional: She is oriented to person, place, and time. She appears well-developed and well-nourished. No distress.  Cardiovascular: Normal rate and regular rhythm.  No murmur heard. Pulmonary/Chest: Effort normal and breath sounds normal. No respiratory distress.  Abdominal: Soft. Bowel sounds are normal. She exhibits distension. There is no tenderness.  Musculoskeletal: Normal range of motion. She exhibits no edema or tenderness.  Neurological: She is alert and oriented to person, place, and time.  Vitals reviewed.  Assessment/Plan:  Active Problems:   Gastroenteritis   Viral gastroenteritis  Gastroenteritis: 31 year old female who presented to the ED with signs and symptoms consistent with gastroenteritis.  Recent discharge with Arna Mediciora virus.  Presents in a similar manner.  Past medical history of celiac disease which is unconfirmed patient denies recent meal containing gluten.  This is does not appear to be bacterial at this time given her  current symptoms and previous Arna Mediciora virus diagnosis.  However, superimposed bacterial infection of viral infection is possible and we will continue symptomatic treatment and further testing. -N.p.o., advance diet as tolerated -IV fluids at 150 cc an hour continue -Zofran 4 mg every 6 hours as needed, Imodium 2 mg as needed for loose stool -Repeat labs in a.m. -Fecal lactoferrin positive -Follow-up blood cultures -UDS negative -GI panel ordered needs collected, enteric precautions until this is completed  History of celiac disease: Patient self reports a history of cyclic disease diagnosed while in [place], however no biopsy or endoscopy has been performed as reported by the patient for confirmation.  She denies exposure to gluten prior to her recent episodes of diarrhea, bloating, and vomiting.  History of diabetes mellitus: Last A1c 6.2 on 11/29, patient on metformin as an outpatient which may be attributed to the acidosis and diarrhea.  We will consider metformin induced chronic late onset diarrhea as a possible etiology for her current presentation if unable to determine GI pathogen. -Hold home metformin -Insulin sensitive sliding scale.  Dispo: Anticipated discharge in approximately 1-2 day(s).   Lanelle BalHarbrecht, Palin Tristan, MD 01/19/2017, 6:53 AM Pager: Pager# [phone]

## 2017-01-20 DIAGNOSIS — Z8719 Personal history of other diseases of the digestive system: Secondary | ICD-10-CM

## 2017-01-20 DIAGNOSIS — E872 Acidosis: Secondary | ICD-10-CM

## 2017-01-20 DIAGNOSIS — A0832 Astrovirus enteritis: Principal | ICD-10-CM

## 2017-01-20 LAB — GLUCOSE, CAPILLARY
GLUCOSE-CAPILLARY: 94 mg/dL (ref 65–99)
GLUCOSE-CAPILLARY: 83 mg/dL (ref 65–99)
Glucose-Capillary: 78 mg/dL (ref 65–99)
Glucose-Capillary: 84 mg/dL (ref 65–99)

## 2017-01-20 MED ORDER — SODIUM CHLORIDE 0.9 % IV SOLN
INTRAVENOUS | Status: AC
  Administered 2017-01-20 – 2017-01-21 (×2): via INTRAVENOUS

## 2017-01-20 NOTE — Progress Notes (Signed)
   Subjective: The patient was lying in her bed today sleeping upon entering the room.  She stated that she felt better but has not been able to tolerate significant p.o. intake since admission.  Patient tested to decreased diarrhea, improvement in her abdominal pain bloating, and overall feeling slightly better.  The patient endorsed mild nausea, mild abdominal distention, occasional bouts of loose stool and having been exposed to multiple sick children at a children's birthday party the day prior to her symptom onset.  Objective:  Vital signs in last 24 hours: Vitals:   01/19/17 0543 01/19/17 1337 01/19/17 2139 01/20/17 0606  BP: 121/77 104/69 124/80 113/71  Pulse: (!) 112 97 (!) 102 91  Resp: 20 18 17 17   Temp: 99.9 F (37.7 C) 98.6 F (37 C) 99.1 F (37.3 C) 99.3 F (37.4 C)  TempSrc: Oral Oral Oral Oral  SpO2: 96% 97% 98% 97%  Weight:      Height:       ROS negative except as per HPI.  Physical Exam  Constitutional: She appears well-developed and well-nourished. No distress.  Cardiovascular: Normal rate and regular rhythm.  No murmur heard. Pulmonary/Chest: Effort normal and breath sounds normal. No respiratory distress.  Abdominal: Soft. Bowel sounds are normal. She exhibits no distension.  Vitals reviewed.  Assessment/Plan:  Active Problems:   Gastroenteritis  Gastroenteritis: 31 year old female who presented to the ED with signs and symptoms consistent with gastroenteritis.  Recent discharge after a Arna Mediciora virus gastroenteritis.  Presents in a similar manner.  Past medical history of celiac disease which is unconfirmed, patient denies recent meal containing gluten.  This does not appear to be bacterial at this time given her current symptoms and previous Arna Mediciora virus diagnosis.  However, superimposed bacterial infection of viral infection is possible and we will continue symptomatic treatment and further testing. -N.p.o., advance diet as tolerated -IV fluids at 100 cc an  hour continued -Zofran 4 mg every 6 hours as needed, Imodium 2 mg as needed for loose stool -Labs the prior day indicated reduction white blood cell count 8.1, hemoglobin of 10.9, sodium of 137 and potassium 3.7, will repeat if indicated -Fecal lactoferrin positive -Follow-up blood cultures -UDS negative -GI panel ordered-patient is positive for astrovirus, typically only because of infection, immunocompromised host, although to be institutionalized.  History of celiac disease: Patient self reports a history of cyclic disease diagnosed while in [place], however no biopsy or endoscopy has been performed as reported by the patient for confirmation.  She denies exposure to gluten prior to her recent episodes of diarrhea, bloating, and vomiting, although she attested to eating crackers prior to the most recent event.  The gluten content of these crackers was nonspecified.  History of diabetes mellitus: Last A1c 6.2 on 11/29, patient on metformin as an outpatient which may be attributed to the acidosis and diarrhea.  We will consider metformin induced chronic late onset diarrhea as a possible etiology for her current presentation if unable to determine GI pathogen. -Hold home metformin -Insulin sensitive sliding scale.   Dispo: Anticipated discharge in approximately 1-2 day(s).   Lanelle BalHarbrecht, Kaysia Willard, MD 01/20/2017, 7:08 AM Pager: Pager# [phone]

## 2017-01-21 ENCOUNTER — Inpatient Hospital Stay (HOSPITAL_COMMUNITY): Payer: BLUE CROSS/BLUE SHIELD

## 2017-01-21 DIAGNOSIS — Z91018 Allergy to other foods: Secondary | ICD-10-CM

## 2017-01-21 LAB — GLUCOSE, CAPILLARY
GLUCOSE-CAPILLARY: 108 mg/dL — AB (ref 65–99)
Glucose-Capillary: 91 mg/dL (ref 65–99)

## 2017-01-21 MED ORDER — ALBUTEROL SULFATE (2.5 MG/3ML) 0.083% IN NEBU
3.0000 mL | INHALATION_SOLUTION | Freq: Four times a day (QID) | RESPIRATORY_TRACT | Status: DC | PRN
  Administered 2017-01-21: 3 mL via RESPIRATORY_TRACT
  Filled 2017-01-21: qty 3

## 2017-01-21 MED ORDER — GUAIFENESIN 100 MG/5ML PO SOLN
5.0000 mL | ORAL | Status: DC | PRN
  Administered 2017-01-21: 100 mg via ORAL
  Filled 2017-01-21: qty 5

## 2017-01-21 MED ORDER — ONDANSETRON 4 MG PO TBDP: 4.0000 mg | ORAL_TABLET | Freq: Three times a day (TID) | ORAL | 0 refills | Status: DC | PRN

## 2017-01-21 MED ORDER — GUAIFENESIN-CODEINE 100-10 MG/5ML PO SOLN
5.0000 mL | ORAL | Status: DC | PRN
  Administered 2017-01-21: 5 mL via ORAL
  Filled 2017-01-21: qty 5

## 2017-01-21 MED ORDER — BENZONATATE 100 MG PO CAPS: 100.0000 mg | ORAL_CAPSULE | Freq: Three times a day (TID) | ORAL | 0 refills | Status: DC | PRN

## 2017-01-21 MED ORDER — BENZONATATE 100 MG PO CAPS
100.0000 mg | ORAL_CAPSULE | Freq: Three times a day (TID) | ORAL | Status: DC | PRN
  Administered 2017-01-21 (×2): 100 mg via ORAL
  Filled 2017-01-21 (×2): qty 1

## 2017-01-21 NOTE — Progress Notes (Signed)
Notified of pt complaints of persistent dry cough this evening which has not been relieved with robitussin, as well as associated shortness of breath. Her cough started yesterday and has progressed through the day and now interfering with sleep. No sore throat, she states she feels she has ongoing sinus congestion issues. She reports her SOB feels similar to her childhood asthma (notes using inhalers as a child up until about 6th grade at which point she no longer experienced respiratory sx), also notes a history of pneumonia as a child. She reports getting a cold 4-5 times a year and recovering over about 2 weeks.   Upon entering the pt room she was moving about the room without difficulty. On examination, her vital signs are stable with no tachypnea, 96% on RA, afebrile. Very faint end expiratory wheeze on one breath during auscultation and otherwise clear.   Her respiratory symptoms appear most consistent with an URI, likely viral in nature. She is not significantly wheezing and an asthma exacerbation is less likely, also feel pneumonia is unlikely at this point given current clinical picture. Given her somewhat unusual presentation of astrovirus as the pathogen causing her GI symptoms, this may prompt consideration for further workup, however, will defer to the primary team and treat symptomatically at this time with guaifenesin-codeine, tessalon perles, and albuterol inhaler prn should her wheezing or respiratory status worsen.

## 2017-01-21 NOTE — Discharge Instructions (Signed)
FOLLOW-UP INSTRUCTIONS When: one week with a primary care office For: Hospital discharge follow-up What to bring: all of your medications  Viral Gastroenteritis, Adult Viral gastroenteritis is also known as the stomach flu. This condition is caused by certain germs (viruses). These germs can be passed from person to person very easily (are very contagious). This condition can cause sudden watery poop (diarrhea), fever, and throwing up (vomiting). Having watery poop and throwing up can make you feel weak and cause you to get dehydrated. Dehydration can make you tired and thirsty, make you have a dry mouth, and make it so you pee (urinate) less often. Older adults and people with other diseases or a weak defense system (immune system) are at higher risk for dehydration. It is important to replace the fluids that you lose from having watery poop and throwing up. Follow these instructions at home: Follow instructions from your doctor about how to care for yourself at home. Eating and drinking  Follow these instructions as told by your doctor:  Take an oral rehydration solution (ORS). This is a drink that is sold at pharmacies and stores.  Drink clear fluids in small amounts as you are able, such as: ? Water. ? Ice chips. ? Diluted fruit juice. ? Low-calorie sports drinks.  Eat bland, easy-to-digest foods in small amounts as you are able, such as: ? Bananas. ? Applesauce. ? Rice. ? Low-fat (lean) meats. ? Toast. ? Crackers.  Avoid fluids that have a lot of sugar or caffeine in them.  Avoid alcohol.  Avoid spicy or fatty foods.  General instructions  Drink enough fluid to keep your pee (urine) clear or pale yellow.  Wash your hands often. If you cannot use soap and water, use hand sanitizer.  Make sure that all people in your home wash their hands well and often.  Rest at home while you get better.  Take over-the-counter and prescription medicines only as told by your  doctor.  Watch your condition for any changes.  Take a warm bath to help with any burning or pain from having watery poop.  Keep all follow-up visits as told by your doctor. This is important. Contact a doctor if:  You cannot keep fluids down.  Your symptoms get worse.  You have new symptoms.  You feel light-headed or dizzy.  You have muscle cramps. Get help right away if:  You have chest pain.  You feel very weak or you pass out (faint).  You see blood in your throw-up.  Your throw-up looks like coffee grounds.  You have bloody or black poop (stools) or poop that look like tar.  You have a very bad headache, a stiff neck, or both.  You have a rash.  You have very bad pain, cramping, or bloating in your belly (abdomen).  You have trouble breathing.  You are breathing very quickly.  Your heart is beating very quickly.  Your skin feels cold and clammy.  You feel confused.  You have pain when you pee.  You have signs of dehydration, such as: ? Dark pee, hardly any pee, or no pee. ? Cracked lips. ? Dry mouth. ? Sunken eyes. ? Sleepiness. ? Weakness. This information is not intended to replace advice given to you by your health care provider. Make sure you discuss any questions you have with your health care provider. Document Released: 07/14/2007 Document Revised: 08/15/2015 Document Reviewed: 10/01/2014 Elsevier Interactive Patient Education  2017 [employer]

## 2017-01-21 NOTE — Progress Notes (Signed)
Pt states that she is wheezing "like I used to when I had asthma."  Upon respiratory assessment, this nurse noted slight expiratory wheezes on the right side.  Pt has a non-productive cough, unrelieved by guaifenesin.  Paged Dr. Anthonette LegatoHarden with [facility] who ordered albuterol Q6 PRN, Tessalon 3 times a day PRN, and guaifenesin-codeine 100-10 Q4 PRN.  Will continue to monitor.

## 2017-01-21 NOTE — Discharge Summary (Signed)
Name: Kristi Jacobson MRN: [id number] DOB: 1985/11/28 31 y.o. PCP: Princess Bruins, MD  Date of Admission: 01/18/2017  7:44 PM Date of Discharge: 01/22/2017 Attending Physician: Gilles Chiquito, MD  Discharge Diagnosis: Active Problems:   Gastroenteritis  Discharge Medications: Allergies as of 01/21/2017      Reactions   Cephalosporins Anaphylaxis   Penicillins Anaphylaxis   Has patient had a PCN reaction causing immediate rash, facial/tongue/throat swelling, SOB or lightheadedness with hypotension: Yes Has patient had a PCN reaction causing severe rash involving mucus membranes or skin necrosis: Yes Has patient had a PCN reaction that required hospitalization: Yes Has patient had a PCN reaction occurring within the last 10 years: Yes If all of the above answers are "NO", then may proceed with Cephalosporin use.   Gluten Meal Diarrhea, Nausea And Vomiting, Other (See Comments)   Has Celiac Disease: Severe stomach cramping, extreme exhaustion, and constipation also   Nyquil Hbp Cold & Flu [dm-doxylamine-acetaminophen] Other (See Comments)   One unknown ingredient caused hallucinations   Other Diarrhea, Nausea And Vomiting, Other (See Comments)   Has Celiac Disease: NO BARLEY/NO RYE/NO SEEDS: Severe stomach cramping, extreme exhaustion, and constipation also   Wheat Bran Diarrhea, Nausea And Vomiting, Other (See Comments)   Has Celiac Disease: Severe stomach cramping, extreme exhaustion, and constipation also      Medication List    STOP taking these medications   multivitamin with minerals Tabs tablet     TAKE these medications   benzonatate 100 MG capsule Commonly known as:  TESSALON Take 1 capsule (100 mg total) by mouth 3 (three) times daily as needed for cough.   drospirenone-ethinyl estradiol 3-0.02 MG tablet Commonly known as:  YAZ Take 1 tablet daily by mouth. May use continuously   ibuprofen 800 MG tablet Commonly known as:  ADVIL,MOTRIN Take 1 tablet  (800 mg total) by mouth 3 (three) times daily.   metFORMIN 500 MG 24 hr tablet Commonly known as:  GLUCOPHAGE-XR Take 1 tablet (500 mg total) by mouth daily with breakfast.   ondansetron 4 MG disintegrating tablet Commonly known as:  ZOFRAN-ODT Take 1 tablet (4 mg total) by mouth every 8 (eight) hours as needed for nausea or vomiting.   ONE TOUCH ULTRA TEST test strip Generic drug:  glucose blood TEST WITH 1 STRIP DAILY.   ONETOUCH VERIO IQ SYSTEM w/Device Kit Use to check blood sugar 1 time per day.   UNABLE TO FIND Kombucha tea: Drink one to two times a week as needed for probiotic effect       Disposition and follow-up:   [name] was discharged from [facility] in Stable condition.  At the hospital follow up visit please address:  1.  Viral gastroenteritis with GI panel positive for Astrovirus.      Please assist the patient with establishing with a GI doctor for proper evaluation and diagnosis of her potential celiac disease and recurrent viral gastroenteritis.   2.  Labs / imaging needed at time of follow-up: BMP  3.  Pending labs/ test needing follow-up: n/a  Follow-up Appointments: Follow-up Information    [facility]. Schedule an appointment as soon as possible for a visit in 1 week(s).   Contact information: [place] [phone] [facility] Course by problem list: Active Problems:   Gastroenteritis   1.  Viral Gastroenteritis: Kristi Jacobson is a 31 year old female who presented  with signs and symptoms of acute gastroenteritis. She was hydrated with normal saline and placed on a liquid diet as tolerated.  In addition a gastrointestinal panel was ordered and resulted as positive for astrovirus. Although unusual for her demographic and medical history the combination of self reported celiac disease with a recent norovirus gastroenteritis she  was likely predisposed to other viral illnesses. The patient recovered over the next few days from her significant GI losses and was discharged home able to tolerate soft foods. The stool panel was negative for many common bacterial and viral organisms which in conjunction with her rapidly improving symptoms and resolution of her leukocytosis with fluids alone lead to her discharge. We would recommend that she establish with a gastroenterologist and undergo testing to determine the status of her self reported celiac disease.  Discharge Vitals:   BP 124/78 (BP Location: Left Arm)   Pulse 92   Temp 98.7 F (37.1 C) (Oral)   Resp 20   Ht _0  (1.626 m)   Wt 199 lb 1.2 oz (90.3 kg)   LMP 12/30/2016 Comment: neg preg test  SpO2 98%   BMI 34.17 kg/m   Pertinent Labs, Studies, and Procedures:  CMP Latest Ref Rng & Units 01/19/2017 01/18/2017 01/07/2017  Glucose 65 - 99 mg/dL 112(H) 122(H) 120(H)  BUN 6 - 20 mg/dL 6 7 <5(L)  Creatinine 0.44 - 1.00 mg/dL 0.59 0.64 0.50  Sodium 135 - 145 mmol/L 137 132(L) 136  Potassium 3.5 - 5.1 mmol/L 3.7 4.0 3.4(L)  Chloride 101 - 111 mmol/L 111 101 108  CO2 22 - 32 mmol/L 19(L) 21(L) 22  Calcium 8.9 - 10.3 mg/dL 7.9(L) 9.3 7.7(L)  Total Protein 6.5 - 8.1 g/dL - 7.8 -  Total Bilirubin 0.3 - 1.2 mg/dL - 0.4 -  Alkaline Phos 38 - 126 U/L - 61 -  AST 15 - 41 U/L - 22 -  ALT 14 - 54 U/L - 27 -   CBC Latest Ref Rng & Units 01/19/2017 01/18/2017 01/07/2017  WBC 4.0 - 10.5 K/uL 8.1 17.9(H) 8.3  Hemoglobin 12.0 - 15.0 g/dL 10.9(L) 13.3 10.8(L)  Hematocrit 36.0 - 46.0 % 34.7(L) 40.0 33.9(L)  Platelets 150 - 400 K/uL 217 283 194     Specimen Information: Stool      Ref Range & Units 3d ago  Campylobacter species NOT DETECTED NOT DETECTED   Plesimonas shigelloides NOT DETECTED NOT DETECTED   Salmonella species NOT DETECTED NOT DETECTED   Yersinia enterocolitica NOT DETECTED NOT DETECTED   Vibrio species NOT DETECTED NOT DETECTED   Vibrio cholerae NOT  DETECTED NOT DETECTED   Enteroaggregative E coli (EAEC) NOT DETECTED NOT DETECTED   Enteropathogenic E coli (EPEC) NOT DETECTED NOT DETECTED   Enterotoxigenic E coli (ETEC) NOT DETECTED NOT DETECTED   Shiga like toxin producing E coli (STEC) NOT DETECTED NOT DETECTED   Shigella/Enteroinvasive E coli (EIEC) NOT DETECTED NOT DETECTED   Cryptosporidium NOT DETECTED NOT DETECTED   Cyclospora cayetanensis NOT DETECTED NOT DETECTED   Entamoeba histolytica NOT DETECTED NOT DETECTED   Giardia lamblia NOT DETECTED NOT DETECTED   Adenovirus F40/41 NOT DETECTED NOT DETECTED   Astrovirus NOT DETECTED DETECTED Abnormal    Norovirus GI/GII NOT DETECTED NOT DETECTED   Rotavirus A NOT DETECTED NOT DETECTED   Sapovirus (I, II, IV, and V) NOT DETECTED NOT DETECTED   Resulting Agency  [facility]         Discharge Instructions: Discharge Instructions  Call MD for:  temperature >100.4   Complete by:  As directed    Diet - low sodium heart healthy   Complete by:  As directed    Diet - low sodium heart healthy   Complete by:  As directed    Increase activity slowly   Complete by:  As directed    Increase activity slowly   Complete by:  As directed       Signed: Kathi Ludwig, MD 01/22/2017, 7:21 AM   Pager: Pager# [phone]

## 2017-01-21 NOTE — Progress Notes (Signed)
NURSING PROGRESS NOTE  Kristi Jacobson [id number] Discharge Data: 01/21/2017 5:57 PM Attending Provider: Sid Falcon, MD EVO:[id number], Truddie Hidden, MD     [name] to be D/C'd Home per MD order.  Discussed with the patient the After Visit Summary and all questions fully answered. All IV's discontinued with no bleeding noted. All belongings returned to patient for patient to take home.   Last Vital Signs:  Blood pressure 124/78, pulse 92, temperature 98.7 F (37.1 C), temperature source Oral, resp. rate 20, height _0  (1.626 m), weight 90.3 kg (199 lb 1.2 oz), last menstrual period 12/30/2016, SpO2 98 %.  Discharge Medication List Allergies as of 01/21/2017      Reactions   Cephalosporins Anaphylaxis   Penicillins Anaphylaxis   Has patient had a PCN reaction causing immediate rash, facial/tongue/throat swelling, SOB or lightheadedness with hypotension: Yes Has patient had a PCN reaction causing severe rash involving mucus membranes or skin necrosis: Yes Has patient had a PCN reaction that required hospitalization: Yes Has patient had a PCN reaction occurring within the last 10 years: Yes If all of the above answers are "NO", then may proceed with Cephalosporin use.   Gluten Meal Diarrhea, Nausea And Vomiting, Other (See Comments)   Has Celiac Disease: Severe stomach cramping, extreme exhaustion, and constipation also   Nyquil Hbp Cold & Flu [dm-doxylamine-acetaminophen] Other (See Comments)   One unknown ingredient caused hallucinations   Other Diarrhea, Nausea And Vomiting, Other (See Comments)   Has Celiac Disease: NO BARLEY/NO RYE/NO SEEDS: Severe stomach cramping, extreme exhaustion, and constipation also   Wheat Bran Diarrhea, Nausea And Vomiting, Other (See Comments)   Has Celiac Disease: Severe stomach cramping, extreme exhaustion, and constipation also      Medication List    STOP taking these medications   multivitamin with minerals Tabs tablet     TAKE  these medications   benzonatate 100 MG capsule Commonly known as:  TESSALON Take 1 capsule (100 mg total) by mouth 3 (three) times daily as needed for cough.   drospirenone-ethinyl estradiol 3-0.02 MG tablet Commonly known as:  YAZ Take 1 tablet daily by mouth. May use continuously   ibuprofen 800 MG tablet Commonly known as:  ADVIL,MOTRIN Take 1 tablet (800 mg total) by mouth 3 (three) times daily.   metFORMIN 500 MG 24 hr tablet Commonly known as:  GLUCOPHAGE-XR Take 1 tablet (500 mg total) by mouth daily with breakfast.   ondansetron 4 MG disintegrating tablet Commonly known as:  ZOFRAN-ODT Take 1 tablet (4 mg total) by mouth every 8 (eight) hours as needed for nausea or vomiting.   ONE TOUCH ULTRA TEST test strip Generic drug:  glucose blood TEST WITH 1 STRIP DAILY.   ONETOUCH VERIO IQ SYSTEM w/Device Kit Use to check blood sugar 1 time per day.   UNABLE TO FIND Kombucha tea: Drink one to two times a week as needed for probiotic effect

## 2017-01-21 NOTE — Progress Notes (Signed)
Paged provider discharge order in but not reconciled. Per provider they will be making contact with patient again and finish out reconciliation. Will discharge as soon as possible.

## 2017-01-21 NOTE — Progress Notes (Signed)
Per Ester in infection prevention patient should be on standard precautions for astrovirus.

## 2017-01-21 NOTE — Progress Notes (Signed)
Subjective: The patient was resting in her bed today upon entering the room. She stated that she felt much better, he pain had resolved and she denied diarrhea for the prior day and overnight. She added that she did developed a minor cough, rhinorrhea, it was possibly productive and associated with minor wheezing overnight.  Patient states that she was able to tolerate her liquid diet evening prior and this morning without significant issue.  As such she was advised to follow a evaluation with a chest x-ray she will be discharged home if this was clear.  Objective:  Vital signs in last 24 hours: Vitals:   01/20/17 0606 01/20/17 1405 01/20/17 2209 01/21/17 0137  BP: 113/71 113/81 112/75 115/66  Pulse: 91 90 72 (!) 112  Resp: 17 18 16 18   Temp: 99.3 F (37.4 C) 99 F (37.2 C) 98.9 F (37.2 C) 99.5 F (37.5 C)  TempSrc: Oral Oral Oral Oral  SpO2: 97% 100% 98% 96%  Weight:      Height:       ROS negative except as per HPI.  Physical Exam  Constitutional: She is oriented to person, place, and time. She appears well-developed and well-nourished. No distress.  Cardiovascular: Normal rate and intact distal pulses.  No murmur heard. Pulmonary/Chest: Effort normal. No respiratory distress. She has wheezes (Upper airways).  Abdominal: Soft. Bowel sounds are normal. She exhibits no distension.  Musculoskeletal: She exhibits no edema or tenderness.  Neurological: She is alert and oriented to person, place, and time.  Skin: Capillary refill takes less than 2 seconds.  Nursing note and vitals reviewed.  Assessment/Plan:  Active Problems:   Gastroenteritis  Gastroenteritis: 31 year old female who presented to the ED with signs and symptoms consistent with gastroenteritis. Recent discharge after a Arna Mediciora virus gastroenteritis. Presents in a similar manner.Past medical history of celiac disease which is unconfirmed, patient denies recent mealcontaining gluten.This does not appear to be  bacterial at this time given her current symptoms and previous Arna Mediciora virus diagnosis. However, superimposed bacterial infection of viral infection is possible and we will continue symptomatic treatment and furthertesting. -Diet advanced to full liquid -IV fluids at 75cc hours -Zofran 4 mg every 6 hours as needed, Imodium 2 mg as needed for loose stool -With the normalization of her labs the prior day and the improvement in symptoms no labs were indicated. -Fecal lactoferrinpositive -Follow-up blood cultures, negative x3 days -UDS negative -GI panel ordered-patient is positive for astrovirus, typically only because of infection, immunocompromised host, although to be institutionalized.  To be considered: Delayed onset Metformin induced GI side effects has been considered. In addition sigAD can not be ruled out given the patients history of asthma, sinopulmonary and gastroenteritis and possible celiac disease.    History of celiac disease: Patient self reports a history of celiac disease diagnosed while in [place], however no biopsy or endoscopy has been performed as reported by the patient for confirmation. She denies exposure to gluten prior to her recent episodes of diarrhea, bloating, and vomiting, although she attested to eating crackers prior to the most recent event.  The gluten content of these crackers was nil.   History of diabetes mellitus: No changes today Last A1c 6.2 on 11/29, patient on metformin as an outpatient which may be attributed to the acidosis and diarrhea. We will consider metformin induced chronic late onset diarrhea as a possible etiology for her current presentation if unable to determine GI pathogen. -Hold home metformin -Insulin sensitive sliding scale.  Dispo: Anticipated  discharge in approximately 0-1 day(s).   Lanelle BalHarbrecht, Shandreka Dante, MD 01/21/2017, 12:17 PM Pager: Pager# [phone]

## 2017-01-23 LAB — CULTURE, BLOOD (ROUTINE X 2)
CULTURE: NO GROWTH
Culture: NO GROWTH
Special Requests: ADEQUATE

## 2017-02-14 ENCOUNTER — Other Ambulatory Visit: Payer: Self-pay

## 2017-02-14 MED ORDER — GLUCOSE BLOOD VI STRP: ORAL_STRIP | 12 refills | Status: AC

## 2017-02-14 MED ORDER — ACCU-CHEK GUIDE W/DEVICE KIT: 1.0000 | PACK | Freq: Once | 0 refills | Status: AC

## 2017-02-14 MED ORDER — ACCU-CHEK MULTICLIX LANCETS MISC: 12 refills | Status: AC

## 2017-07-08 ENCOUNTER — Other Ambulatory Visit: Payer: Self-pay | Admitting: Endocrinology

## 2017-10-08 ENCOUNTER — Other Ambulatory Visit: Payer: Self-pay | Admitting: Endocrinology

## 2017-10-08 NOTE — Telephone Encounter (Signed)
Please refill x 1 Ov is due  

## 2017-10-30 ENCOUNTER — Encounter (HOSPITAL_COMMUNITY): Payer: Self-pay

## 2017-10-30 ENCOUNTER — Ambulatory Visit (HOSPITAL_COMMUNITY)
Admission: EM | Admit: 2017-10-30 | Discharge: 2017-10-30 | Disposition: A | Discharge status: Home / Self Care | Payer: BLUE CROSS/BLUE SHIELD | Attending: Family Medicine | Admitting: Family Medicine

## 2017-10-30 DIAGNOSIS — R6889 Other general symptoms and signs: Secondary | ICD-10-CM

## 2017-10-30 MED ORDER — OSELTAMIVIR PHOSPHATE 75 MG PO CAPS: 75.0000 mg | ORAL_CAPSULE | Freq: Two times a day (BID) | ORAL | 0 refills | Status: DC

## 2017-10-30 MED ORDER — FLUTICASONE PROPIONATE 50 MCG/ACT NA SUSP: 1.0000 | Freq: Every day | NASAL | 0 refills | Status: DC

## 2017-10-30 MED ORDER — BENZONATATE 200 MG PO CAPS: 200.0000 mg | ORAL_CAPSULE | Freq: Three times a day (TID) | ORAL | 0 refills | Status: AC | PRN

## 2017-10-30 MED ORDER — ONDANSETRON 4 MG PO TBDP: 4.0000 mg | ORAL_TABLET | Freq: Three times a day (TID) | ORAL | 0 refills | Status: DC | PRN

## 2017-10-30 MED ORDER — CETIRIZINE HCL 10 MG PO CAPS: 10.0000 mg | ORAL_CAPSULE | Freq: Every day | ORAL | 0 refills | Status: DC

## 2017-10-30 NOTE — Discharge Instructions (Addendum)
Your symptoms are most likely flulike, this is a viral illness that will run its course over about a week Please begin taking Tamiflu twice daily for the next 5 days Zofran as needed for nausea and vomiting For congestion please use daily allergy pill like Zyrtec, please also use with Flonase nasal spray For cough you may use Tessalon as needed every 8 hours Please push fluids, may drink Pedialyte or Gatorade Begin with bland diet  Please continue to monitor symptoms, follow-up if symptoms persisting, developing fever, chest pain, shortness of breath, heart rate remaining elevated, increased weakness or dehydration

## 2017-10-30 NOTE — ED Triage Notes (Signed)
Pt presents with cold symptoms; nasal drainage, congestion, persistent cough, headache, reflux and nausea and diarrhea.

## 2017-10-31 NOTE — ED Provider Notes (Signed)
[facility]    CSN: [id number] Arrival date & time: 10/30/17  1529     History   Chief Complaint Chief Complaint  Patient presents with  . URI    HPI Kristi Jacobson is a 32 y.o. female history of celiac, DM type II; Patient is presenting with URI symptoms- congestion, cough. Denies sore throat.  Patient also notes that she has felt weak, had body aches, as well as gas and diarrhea.  Her cold symptoms began on Thursday/Friday have persisted for the past 2 days, her GI symptoms began yesterday.  Patient's main complaints are gas, overall feeling poor. Symptoms have been going on for 2 to 3 days. Patient has tried Tums, Gas-X., with minimal relief.  She reports low-grade fevers.  Has had nausea, but denies vomiting.  Denies shortness of breath and chest pain.    HPI  Past Medical History:  Diagnosis Date  . Asthma   . Celiac disease   . Menorrhagia     Patient Active Problem List   Diagnosis Date Noted  . Gastroenteritis 01/06/2017  . T2DM (type 2 diabetes mellitus) (HCC) 01/06/2017  . Asthma   . Menorrhagia   . Celiac disease     Past Surgical History:  Procedure Laterality Date  . WISDOM TOOTH EXTRACTION      OB History    Gravida  0   Para  0   Term  0   Preterm  0   AB  0   Living  0     SAB  0   TAB  0   Ectopic  0   Multiple  0   Live Births  0            Home Medications    Prior to Admission medications   Medication Sig Start Date End Date Taking? Authorizing Provider  benzonatate (TESSALON) 200 MG capsule Take 1 capsule (200 mg total) by mouth 3 (three) times daily as needed for up to 7 days for cough. 10/30/17 11/06/17  Marlen Mollica C, PA-C  Cetirizine HCl 10 MG CAPS Take 1 capsule (10 mg total) by mouth daily for 10 days. 10/30/17 11/09/17  Kiaria Quinnell C, PA-C  fluticasone (FLONASE) 50 MCG/ACT nasal spray Place 1-2 sprays into both nostrils daily for 7 days. 10/30/17 11/06/17  Ala Kratz C, PA-C  glucose  blood (ACCU-CHEK GUIDE) test strip Use as instructed to test once daily 02/14/17   Romero Belling, MD  Lancets (ACCU-CHEK MULTICLIX) lancets Use as instructed to test once daily 02/14/17   Romero Belling, MD  metFORMIN (GLUCOPHAGE-XR) 500 MG 24 hr tablet TAKE 1 TABLET BY MOUTH EVERY DAY WITH BREAKFAST 10/11/17   Romero Belling, MD  ondansetron (ZOFRAN ODT) 4 MG disintegrating tablet Take 1 tablet (4 mg total) by mouth every 8 (eight) hours as needed for nausea or vomiting. 10/30/17   Cristofher Livecchi C, PA-C  oseltamivir (TAMIFLU) 75 MG capsule Take 1 capsule (75 mg total) by mouth every 12 (twelve) hours. 10/30/17   Willer Osorno C, PA-C  UNABLE TO FIND Kombucha tea: Drink one to two times a week as needed for probiotic effect    [provider]    Family History Family History  Problem Relation Age of Onset  . Diabetes Brother 30  . Breast cancer Paternal Grandmother     Social History Social History   Tobacco Use  . Smoking status: Never Smoker  . Smokeless tobacco: Never Used  Substance Use Topics  .  Alcohol use: No  . Drug use: No     Allergies   Cephalosporins; Penicillins; Gluten meal; Nyquil hbp cold & flu [dm-doxylamine-acetaminophen]; Other; and Wheat bran   Review of Systems Review of Systems  Constitutional: Positive for appetite change and chills. Negative for activity change, fatigue and fever.  HENT: Positive for congestion, rhinorrhea and sinus pressure. Negative for ear pain, sore throat and trouble swallowing.   Eyes: Negative for discharge and redness.  Respiratory: Positive for cough. Negative for chest tightness and shortness of breath.   Cardiovascular: Negative for chest pain.  Gastrointestinal: Positive for diarrhea and nausea. Negative for abdominal pain and vomiting.  Musculoskeletal: Positive for myalgias.  Skin: Negative for rash.  Neurological: Negative for dizziness, light-headedness and headaches.     Physical Exam Triage Vital Signs ED  Triage Vitals [10/30/17 1656]  Enc Vitals Group     BP 132/83     Pulse Rate (!) 130     Resp 20     Temp 98.5 F (36.9 C)     Temp Source Oral     SpO2 100 %     Weight      Height      Head Circumference      Peak Flow      Pain Score      Pain Loc      Pain Edu?      Excl. in GC?    No data found.  Updated Vital Signs BP 132/83 (BP Location: Right Arm)   Pulse (!) 130   Temp 98.5 F (36.9 C) (Oral)   Resp 20   SpO2 100%   Visual Acuity Right Eye Distance:   Left Eye Distance:   Bilateral Distance:    Right Eye Near:   Left Eye Near:    Bilateral Near:     Physical Exam  Constitutional: She appears well-developed and well-nourished. No distress.  HENT:  Head: Normocephalic and atraumatic.  Bilateral ears without tenderness to palpation of external auricle, tragus and mastoid, EAC's without erythema or swelling, TM's with good bony landmarks and cone of light. Non erythematous.  Oral mucosa pink and moist, no tonsillar enlargement or exudate. Posterior pharynx patent and erythematous, no uvula deviation or swelling. Normal phonation.  Eyes: Conjunctivae are normal.  Neck: Neck supple.  Cardiovascular: Regular rhythm.  No murmur heard. Tachycardia  Pulmonary/Chest: Effort normal and breath sounds normal. No respiratory distress.  Breathing comfortably at rest, CTABL, no wheezing, rales or other adventitious sounds auscultated  Abdominal: Soft. There is no tenderness.  Mild tenderness throughout all 4 quadrants of abdomen, negative rebound  Musculoskeletal: She exhibits no edema.  Neurological: She is alert.  Skin: Skin is warm and dry.  Psychiatric: She has a normal mood and affect.  Nursing note and vitals reviewed.    UC Treatments / Results  Labs (all labs ordered are listed, but only abnormal results are displayed) Labs Reviewed - No data to display  EKG None  Radiology No results found.  Procedures Procedures (including critical care  time)  Medications Ordered in UC Medications - No data to display  Initial Impression / Assessment and Plan / UC Course  I have reviewed the triage vital signs and the nursing notes.  Pertinent labs & imaging results that were available during my care of the patient were reviewed by me and considered in my medical decision making (see chart for details).     URI symptoms GI upset for 2 to 3  days.  Patient is tachycardic, she notes that this typically elevates when at doctor's office due to white coat syndrome.  She states that she does have a way to monitor this at home.  Exam otherwise normal.  Patient with flulike symptoms, will treat symptomatically as viral source.  Will provide Tamiflu, Zofran for nausea, Tylenol and ibuprofen for body aches.  Zyrtec and Flonase for congestion, Tessalon for cough.  Push fluids, discussed importance of initially with diarrhea.  May take Imodium or Pepto-Bismol very frequent bowel movements.  Continue to monitor symptoms.Discussed strict return precautions. Patient verbalized understanding and is agreeable with plan.  Final Clinical Impressions(s) / UC Diagnoses   Final diagnoses:  Flu-like symptoms     Discharge Instructions     Your symptoms are most likely flulike, this is a viral illness that will run its course over about a week Please begin taking Tamiflu twice daily for the next 5 days Zofran as needed for nausea and vomiting For congestion please use daily allergy pill like Zyrtec, please also use with Flonase nasal spray For cough you may use Tessalon as needed every 8 hours Please push fluids, may drink Pedialyte or Gatorade Begin with bland diet  Please continue to monitor symptoms, follow-up if symptoms persisting, developing fever, chest pain, shortness of breath, heart rate remaining elevated, increased weakness or dehydration   ED Prescriptions    Medication Sig Dispense Auth. Provider   oseltamivir (TAMIFLU) 75 MG capsule Take 1  capsule (75 mg total) by mouth every 12 (twelve) hours. 10 capsule Sabeen Piechocki C, PA-C   ondansetron (ZOFRAN ODT) 4 MG disintegrating tablet Take 1 tablet (4 mg total) by mouth every 8 (eight) hours as needed for nausea or vomiting. 20 tablet Imogene Gravelle C, PA-C   Cetirizine HCl 10 MG CAPS Take 1 capsule (10 mg total) by mouth daily for 10 days. 10 capsule Deyton Ellenbecker C, PA-C   fluticasone (FLONASE) 50 MCG/ACT nasal spray Place 1-2 sprays into both nostrils daily for 7 days. 1 g Marra Fraga C, PA-C   benzonatate (TESSALON) 200 MG capsule Take 1 capsule (200 mg total) by mouth 3 (three) times daily as needed for up to 7 days for cough. 28 capsule Adrijana Haros C, PA-C     Controlled Substance Prescriptions [place] Controlled Substance Registry consulted? Not Applicable   Lew Dawes, [place] 10/31/17 1000

## 2018-01-03 ENCOUNTER — Emergency Department (HOSPITAL_COMMUNITY): Payer: BLUE CROSS/BLUE SHIELD

## 2018-01-03 ENCOUNTER — Encounter (HOSPITAL_COMMUNITY): Payer: Self-pay | Admitting: Emergency Medicine

## 2018-01-03 ENCOUNTER — Emergency Department (HOSPITAL_COMMUNITY)
Admission: EM | Admit: 2018-01-03 | Discharge: 2018-01-03 | Disposition: A | Discharge status: Home / Self Care | Payer: BLUE CROSS/BLUE SHIELD | Attending: Emergency Medicine | Admitting: Emergency Medicine

## 2018-01-03 ENCOUNTER — Ambulatory Visit (INDEPENDENT_AMBULATORY_CARE_PROVIDER_SITE_OTHER)
Admission: EM | Admit: 2018-01-03 | Discharge: 2018-01-03 | Disposition: A | Discharge status: Home / Self Care | Payer: BLUE CROSS/BLUE SHIELD | Source: Home / Self Care

## 2018-01-03 ENCOUNTER — Other Ambulatory Visit: Payer: Self-pay

## 2018-01-03 DIAGNOSIS — Z7984 Long term (current) use of oral hypoglycemic drugs: Secondary | ICD-10-CM | POA: Diagnosis not present

## 2018-01-03 DIAGNOSIS — R0682 Tachypnea, not elsewhere classified: Secondary | ICD-10-CM

## 2018-01-03 DIAGNOSIS — E119 Type 2 diabetes mellitus without complications: Secondary | ICD-10-CM | POA: Diagnosis not present

## 2018-01-03 DIAGNOSIS — R002 Palpitations: Secondary | ICD-10-CM | POA: Diagnosis not present

## 2018-01-03 DIAGNOSIS — J45909 Unspecified asthma, uncomplicated: Secondary | ICD-10-CM | POA: Insufficient documentation

## 2018-01-03 DIAGNOSIS — Z79899 Other long term (current) drug therapy: Secondary | ICD-10-CM | POA: Diagnosis not present

## 2018-01-03 DIAGNOSIS — R079 Chest pain, unspecified: Secondary | ICD-10-CM | POA: Diagnosis present

## 2018-01-03 LAB — CBC
HEMATOCRIT: 42.5 % (ref 36.0–46.0)
HEMOGLOBIN: 13.3 g/dL (ref 12.0–15.0)
MCH: 24.5 pg — AB (ref 26.0–34.0)
MCHC: 31.3 g/dL (ref 30.0–36.0)
MCV: 78.4 fL — AB (ref 80.0–100.0)
Platelets: 282 10*3/uL (ref 150–400)
RBC: 5.42 MIL/uL — ABNORMAL HIGH (ref 3.87–5.11)
RDW: 14.3 % (ref 11.5–15.5)
WBC: 17.5 10*3/uL — ABNORMAL HIGH (ref 4.0–10.5)
nRBC: 0 % (ref 0.0–0.2)

## 2018-01-03 LAB — BASIC METABOLIC PANEL
Anion gap: 11 (ref 5–15)
BUN: 5 mg/dL — ABNORMAL LOW (ref 6–20)
CHLORIDE: 103 mmol/L (ref 98–111)
CO2: 22 mmol/L (ref 22–32)
Calcium: 9.4 mg/dL (ref 8.9–10.3)
Creatinine, Ser: 0.59 mg/dL (ref 0.44–1.00)
GFR calc Af Amer: >60 mL/min (ref >60)
GFR calc non Af Amer: >60 mL/min (ref >60)
Glucose, Bld: 136 mg/dL — ABNORMAL HIGH (ref 70–99)
POTASSIUM: 3.9 mmol/L (ref 3.5–5.1)
Sodium: 136 mmol/L (ref 135–145)

## 2018-01-03 LAB — TSH: TSH: 0.832 u[IU]/mL (ref 0.350–4.500)

## 2018-01-03 LAB — I-STAT TROPONIN, ED: TROPONIN I, POC: 0.01 ng/mL (ref 0.00–0.08)

## 2018-01-03 LAB — D-DIMER, QUANTITATIVE (NOT AT ARMC)

## 2018-01-03 MED ORDER — SODIUM CHLORIDE 0.9 % IV BOLUS
1000.0000 mL | Freq: Once | INTRAVENOUS | Status: AC
  Administered 2018-01-03: 1000 mL via INTRAVENOUS

## 2018-01-03 NOTE — ED Provider Notes (Signed)
[facility] EMERGENCY DEPARTMENT Provider Note   CSN: [id number] Arrival date & time: 01/03/18  4098     History   Chief Complaint Chief Complaint  Patient presents with  . Chest Pain  . Tachycardia    HPI Kristi Jacobson is a 32 y.o. female.  32 year old female with history of non insulin dependent diabetes, asthma presents with complaint of chest tightness and increased heart rate.  Patient states that she has a Fitbit watch that has been monitoring her heart rate, states her heart rate is normally in the 70s however for the past week patient has noticed her heart rate is between 105 and 120 at times.  Patient states she woke up this morning feeling like her heart was racing with a heart rate in the 120s, felt tightness in her chest and came to the emergency room.  Patient ports feeling like it is difficult to get a deep breath however does not feel short of breath.  She reports taking Advil Cold and Sinus containing Sudafed last night for mild cold symptoms.  Denies nausea, vomiting, diaphoresis.  Denies history or family history of PE or DVT, denies smoking, denies oral hormone replacement therapy, no recent extended travel, denies swelling or pain in her legs.  Patient states that her brother developed heart failure after a viral illness in her 30s, no other significant family history.  Denies history of thyroid disorder, drug use.  No other complaints or concerns.     Past Medical History:  Diagnosis Date  . Asthma   . Celiac disease   . Menorrhagia     Patient Active Problem List   Diagnosis Date Noted  . Gastroenteritis 01/06/2017  . T2DM (type 2 diabetes mellitus) (HCC) 01/06/2017  . Asthma   . Menorrhagia   . Celiac disease     Past Surgical History:  Procedure Laterality Date  . WISDOM TOOTH EXTRACTION       OB History    Gravida  0   Para  0   Term  0   Preterm  0   AB  0   Living  0     SAB  0   TAB  0   Ectopic  0   Multiple  0   Live Births  0            Home Medications    Prior to Admission medications   Medication Sig Start Date End Date Taking? Authorizing Provider  ELDERBERRY PO Take 1 tablet by mouth daily. chew   Yes [provider]  metFORMIN (GLUCOPHAGE-XR) 500 MG 24 hr tablet TAKE 1 TABLET BY MOUTH EVERY DAY WITH BREAKFAST Patient taking differently: Take 500 mg by mouth daily with breakfast.  10/11/17  Yes Romero Belling, MD  UNABLE TO FIND Kombucha tea: Drink one to two times a week as needed for probiotic effect   Yes [provider]  Vitamin D, Ergocalciferol, (DRISDOL) 1.25 MG (50000 UT) CAPS capsule Take 1.25 Units by mouth once a week. 12/23/17  Yes [provider]  Cetirizine HCl 10 MG CAPS Take 1 capsule (10 mg total) by mouth daily for 10 days. Patient not taking: Reported on 01/03/2018 10/30/17 11/09/17  Wieters, Hallie C, PA-C  fluticasone (FLONASE) 50 MCG/ACT nasal spray Place 1-2 sprays into both nostrils daily for 7 days. Patient not taking: Reported on 01/03/2018 10/30/17 11/06/17  Wieters, Hallie C, PA-C  glucose blood (ACCU-CHEK GUIDE) test strip Use as instructed to test once  daily 02/14/17   Romero Belling, MD  Lancets (ACCU-CHEK MULTICLIX) lancets Use as instructed to test once daily 02/14/17   Romero Belling, MD  ondansetron (ZOFRAN ODT) 4 MG disintegrating tablet Take 1 tablet (4 mg total) by mouth every 8 (eight) hours as needed for nausea or vomiting. Patient not taking: Reported on 01/03/2018 10/30/17   Wieters, Fran Lowes C, PA-C  oseltamivir (TAMIFLU) 75 MG capsule Take 1 capsule (75 mg total) by mouth every 12 (twelve) hours. Patient not taking: Reported on 01/03/2018 10/30/17   Lew Dawes, PA-C    Family History Family History  Problem Relation Age of Onset  . Diabetes Brother 30  . Congestive Heart Failure Brother   . Breast cancer Paternal Grandmother     Social History Social History   Tobacco Use  . Smoking status: Never  Smoker  . Smokeless tobacco: Never Used  Substance Use Topics  . Alcohol use: No  . Drug use: No     Allergies   Cephalosporins; Penicillins; Gluten meal; Nyquil hbp cold & flu [dm-doxylamine-acetaminophen]; Other; and Wheat bran   Review of Systems Review of Systems  Constitutional: Negative for chills and fever.  HENT: Positive for congestion.   Respiratory: Positive for cough and chest tightness. Negative for shortness of breath.   Cardiovascular: Positive for palpitations. Negative for chest pain and leg swelling.  Gastrointestinal: Negative for abdominal pain, nausea and vomiting.  Musculoskeletal: Negative for back pain.  Skin: Negative for rash and wound.  Allergic/Immunologic: Positive for immunocompromised state.  Neurological: Negative for dizziness and weakness.  Hematological: Does not bruise/bleed easily.  Psychiatric/Behavioral: Negative for confusion.  All other systems reviewed and are negative.    Physical Exam Updated Vital Signs BP 122/89   Pulse (!) 109   Temp 98.1 F (36.7 C) (Oral)   Resp 20   Ht 5\' 5"  (1.651 m)   Wt 90.7 kg   LMP 12/30/2017   SpO2 98%   BMI 33.28 kg/m   Physical Exam  Constitutional: She is oriented to person, place, and time. She appears well-developed and well-nourished.  Non-toxic appearance. She does not appear ill. No distress.  HENT:  Head: Normocephalic and atraumatic.  Cardiovascular: Regular rhythm, intact distal pulses and normal pulses. Tachycardia present.  No murmur heard. Pulmonary/Chest: Effort normal and breath sounds normal. She has no decreased breath sounds.  Abdominal: Soft. There is no tenderness.  Musculoskeletal:       Right lower leg: Normal. She exhibits no tenderness and no edema.       Left lower leg: Normal. She exhibits no tenderness and no edema.  Neurological: She is alert and oriented to person, place, and time.  Skin: Skin is warm and dry. She is not diaphoretic.  Psychiatric: Her behavior  is normal. Her mood appears anxious.  Nursing note and vitals reviewed.    ED Treatments / Results  Labs (all labs ordered are listed, but only abnormal results are displayed) Labs Reviewed  BASIC METABOLIC PANEL - Abnormal; Notable for the following components:      Result Value   Glucose, Bld 136 (*)    BUN 5 (*)    All other components within normal limits  CBC - Abnormal; Notable for the following components:   WBC 17.5 (*)    RBC 5.42 (*)    MCV 78.4 (*)    MCH 24.5 (*)    All other components within normal limits  TSH  D-DIMER, QUANTITATIVE (NOT AT [facility])  I-STAT TROPONIN,  ED    EKG EKG Interpretation  Date/Time:  Tuesday January 03 2018 08:30:20 EST Ventricular Rate:  131 PR Interval:    QRS Duration: 91 QT Interval:  313 QTC Calculation: 454 R Axis:   54 Text Interpretation:  Sinus tachycardia Borderline repolarization abnormality Baseline wander in lead(s) V3 similar to prior 11/18 Confirmed by Meridee ScoreButler, Michael [phone]([id number]) on 01/03/2018 8:35:43 AM   Radiology Dg Chest Port 1 View  Result Date: 01/03/2018 CLINICAL DATA:  Chest pain with cardiac palpitations EXAM: PORTABLE CHEST 1 VIEW COMPARISON:  January 21, 2017 FINDINGS: Lungs are clear. Heart is upper normal in size with pulmonary vascularity normal. No adenopathy. No evident bone lesions. No pneumothorax. IMPRESSION: No edema or consolidation.  Stable cardiac silhouette. Electronically Signed   By: Bretta BangWilliam  Woodruff III M.D.   On: 01/03/2018 09:14    Procedures Procedures (including critical care time)  Medications Ordered in ED Medications  sodium chloride 0.9 % bolus 1,000 mL (0 mLs Intravenous Stopped 01/03/18 0946)     Initial Impression / Assessment and Plan / ED Course  I have reviewed the triage vital signs and the nursing notes.  Pertinent labs & imaging results that were available during my care of the patient were reviewed by me and considered in my medical decision making (see chart for  details).  Clinical Course as of Jan 04 1035  Tue Jan 03, 2018  [id number] 32 year old female history of diabetes here with elevated heart rate for a few days and some chest pressure.  She was seen in urgent care this morning and they referred her over here for further evaluation.  Her EKG is sinus tach with some nonspecific ST-T changes.  She is getting chest x-ray lab work and some IV fluids.   [MB]  1033 Review of results, chest x-ray is within normal limits, BMP without significant changes, d-dimer negative, TSH within normal limits.  Troponin negative.  CBC with leukocytosis at 17.5, reports mild cough and cold symptoms.  Patient was given a liter of normal saline, heart rate has improved to upper 90s at time of discharge planning.  Recommend patient contact cardiology for follow-up.  Advised patient to return to the ER for any new or worsening symptoms, avoid medications containing Sudafed, avoid caffeine.  Patient reports feeling better, agreeable with plan for discharge.   [LM]    Clinical Course User Index [LM] Jeannie FendMurphy, Jericha Bryden A, PA-C [MB] Terrilee FilesButler, Michael C, MD   Final Clinical Impressions(s) / ED Diagnoses   Final diagnoses:  Palpitations    ED Discharge Orders    None       Alden HippMurphy, Ethyn Schetter A, PA-C 01/03/18 1036    Terrilee FilesButler, Michael C, MD 01/03/18 1954

## 2018-01-03 NOTE — ED Notes (Signed)
Patient verbalizes understanding of discharge instructions. Opportunity for questioning and answers were provided. Armband removed by staff, pt discharged from ED ambulated to lobby at discharge.

## 2018-01-03 NOTE — Discharge Instructions (Signed)
Avoid caffeine and medications containing Sudafed.  Home to rest, drink plenty of hydrating fluids. Contact cardiology for follow-up appointment.  Return to ER for new or worsening symptoms.

## 2018-01-03 NOTE — ED Triage Notes (Signed)
Reports heart racing for 2 days and she thought to ba anxiety.  Patient reports a burning, tightness in chest currently.  Slight sob.

## 2018-01-03 NOTE — ED Triage Notes (Signed)
Pt from urgent care with c/o CP and tachycardia x2 days.  Mild sob.  Pt thought might be anxiety.

## 2018-01-03 NOTE — ED Notes (Signed)
Head congestion, cough, no fever, taken advil cold and sinus last night

## 2018-01-03 NOTE — ED Notes (Signed)
Dr Delton Seenelson recommended ed for cardiac work up.  Patient agreeable, brenda, emt pushed patient to ed by wheelchair

## 2018-01-11 ENCOUNTER — Ambulatory Visit: Payer: BLUE CROSS/BLUE SHIELD | Admitting: Cardiovascular Disease

## 2018-01-11 ENCOUNTER — Encounter: Payer: Self-pay | Admitting: Cardiovascular Disease

## 2018-01-11 VITALS — BP 127/90 | HR 84 | Ht 65.0 in | Wt 201.0 lb

## 2018-01-11 DIAGNOSIS — R0789 Other chest pain: Secondary | ICD-10-CM | POA: Diagnosis not present

## 2018-01-11 DIAGNOSIS — R000 Tachycardia, unspecified: Secondary | ICD-10-CM

## 2018-01-11 NOTE — Progress Notes (Signed)
Cardiology Office Note   Date:  01/11/2018   ID:  Kristi Jacobson, DOB [birth year], MRN [id number]  PCP:  Dorisann FramesBalan, Bindubal, MD  Cardiologist:   Chilton Siiffany Bazile Mills, MD   No chief complaint on file.    History of Present Illness: Kristi Jacobson is a 32 y.o. female with Celiac disease, diabetes, and PCOS who presents for an evaluation of tachycardia and chest pressure.  [name] was seen in urgent care 11/26 with several days of elevated heart rate and chest pressure.  EKG showed sinus tachycardia at 141 bpm.  Thyroid function was normal and d-dimer and troponin were negative.  WBC was 17,000.  CXR was negative but her presentation was thought to be due to an upper respiratory infection.  Her tachycardia resolved with IV fluids.  Her heart had been racing for a few days.  It was reaching as high as the 130s-140s.  She felt chest pain when lying in bed.  At the time she had a cold.  She was taking Advil Cold and Sinus with pseudoephedrine for over a month.  Since she stopped taking it her heart rate has been in the 80s. It increases to the 100s when she walks or exerts herself.  She has since been diagnosed with bronchitis.  She no longer has chest pain other than when she coughs.  Outside of the current episode she had heart racing as a teen that was attributed to anxiety.  She has no shortness of breath, lower extremity edema, orthopnea or PND.  She exercises by walking 30-60 minutes two or three times per week.  She has no exertional symptoms.     Past Medical History:  Diagnosis Date  . Asthma   . Celiac disease   . Menorrhagia     Past Surgical History:  Procedure Laterality Date  . WISDOM TOOTH EXTRACTION       Current Outpatient Medications  Medication Sig Dispense Refill  . ELDERBERRY PO Take 1 tablet by mouth daily. chew    . glucose blood (ACCU-CHEK GUIDE) test strip Use as instructed to test once daily 100 each 12  . Lancets (ACCU-CHEK MULTICLIX) lancets Use as  instructed to test once daily 100 each 12  . loratadine (CLARITIN) 10 MG tablet Take 10 mg by mouth daily.    . metFORMIN (GLUCOPHAGE-XR) 500 MG 24 hr tablet TAKE 1 TABLET BY MOUTH EVERY DAY WITH BREAKFAST (Patient taking differently: Take 500 mg by mouth daily with breakfast. ) 90 tablet 1  . UNABLE TO FIND Kombucha tea: Drink one to two times a week as needed for probiotic effect    . Vitamin D, Ergocalciferol, (DRISDOL) 1.25 MG (50000 UT) CAPS capsule Take 1.25 Units by mouth once a week.  2   No current facility-administered medications for this visit.     Allergies:   Cephalosporins; Penicillins; Gluten meal; Nyquil hbp cold & flu [dm-doxylamine-acetaminophen]; Other; and Wheat bran    Social History:  The patient  reports that she has never smoked. She has never used smokeless tobacco. She reports that she does not drink alcohol or use drugs.   Family History:  The patient's family history includes Breast cancer in her maternal grandmother and paternal grandmother; Congestive Heart Failure in her brother; Diabetes (age of onset: 4730) in her brother; Hypertension in her maternal grandfather; Skin cancer in her father and mother.    ROS:  Please see the history of present illness.   Otherwise, review of systems are positive for  pain in calves.   All other systems are reviewed and negative.    PHYSICAL EXAM: VS:  BP 127/90 (BP Location: Right Arm)   Pulse 84   Ht 5\' 5"  (1.651 m)   Wt 201 lb (91.2 kg)   LMP 12/30/2017   BMI 33.45 kg/m  , BMI Body mass index is 33.45 kg/m. GENERAL:  Well appearing HEENT:  Pupils equal round and reactive, fundi not visualized, oral mucosa unremarkable NECK:  No jugular venous distention, waveform within normal limits, carotid upstroke brisk and symmetric, no bruits, no thyromegaly LYMPHATICS:  No cervical adenopathy LUNGS:  Clear to auscultation bilaterally HEART:  RRR.  PMI not displaced or sustained,S1 and S2 within normal limits, no S3, no S4, no  clicks, no rubs, no murmurs ABD:  Flat, positive bowel sounds normal in frequency in pitch, no bruits, no rebound, no guarding, no midline pulsatile mass, no hepatomegaly, no splenomegaly EXT:  2 plus pulses throughout, no edema, no cyanosis no clubbing SKIN:  No rashes no nodules NEURO:  Cranial nerves II through XII grossly intact, motor grossly intact throughout PSYCH:  Cognitively intact, oriented to person place and time    EKG:  EKG is not ordered today. 01/03/18: Sinus tachycardia rate 131 bpm.   Recent Labs: 01/18/2017: ALT 27 01/03/2018: BUN 5; Creatinine, Ser 0.59; Hemoglobin 13.3; Platelets 282; Potassium 3.9; Sodium 136; TSH 0.832    Lipid Panel No results found for: CHOL, TRIG, HDL, CHOLHDL, VLDL, LDLCALC, LDLDIRECT    Wt Readings from Last 3 Encounters:  01/11/18 201 lb (91.2 kg)  01/03/18 200 lb (90.7 kg)  01/19/17 199 lb 1.2 oz (90.3 kg)      ASSESSMENT AND PLAN:  # Sinus tachycardia: # Chest pain: Resolved after holding pseudoephedrine and receiving IV fluids.  She only has chest pain when coughing.  No ischemia evaluation is indicated.      Current medicines are reviewed at length with the patient today.  The patient does not have concerns regarding medicines.  The following changes have been made:  no change  Labs/ tests ordered today include:  No orders of the defined types were placed in this encounter.    Disposition:   FU with Lucious Zou C. Duke Salvia, MD, [facility] as needed.      Signed, Edi Gorniak C. Duke Salvia, MD, [facility]  01/11/2018 11:41 AM    [facility]

## 2018-01-11 NOTE — Patient Instructions (Signed)
Medication Instructions:  Your physician recommends that you continue on your current medications as directed. Please refer to the Current Medication list given to you today.  If you need a refill on your cardiac medications before your next appointment, please call your pharmacy.   Lab work: NONE  Testing/Procedures: NONE  Follow-Up: AS NEEDED   

## 2018-05-04 ENCOUNTER — Telehealth: Payer: BLUE CROSS/BLUE SHIELD | Admitting: Nurse Practitioner

## 2018-05-04 DIAGNOSIS — R059 Cough, unspecified: Secondary | ICD-10-CM

## 2018-05-04 DIAGNOSIS — R05 Cough: Secondary | ICD-10-CM

## 2018-05-04 MED ORDER — ALBUTEROL SULFATE HFA 108 (90 BASE) MCG/ACT IN AERS: 2.0000 | INHALATION_SPRAY | Freq: Four times a day (QID) | RESPIRATORY_TRACT | 0 refills | Status: AC | PRN

## 2018-05-04 NOTE — Progress Notes (Signed)
E-Visit for Corona Virus Screening  Based on your current symptoms, you may very well have the virus, however your symptoms are mild. Currently, not all patients are being tested. If the symptoms are mild and there is not a known exposure, performing the test is not indicated.  Coronavirus disease 2019 (COVID-19) is a respiratory illness that can spread from person to person. The virus that causes COVID-19 is a new virus that was first identified in the country of [place] but is now found in multiple other countries and has spread to the [place].  Symptoms associated with the virus are mild to severe fever, cough, and shortness of breath. There is currently no vaccine to protect against COVID-19, and there is no specific antiviral treatment for the virus.   To be considered HIGH RISK for Coronavirus (COVID-19), you have to meet the following criteria:  . Traveled to [place], [place], [place], [place] or [place]; or in the [place] to [place], [place], [place], or [place]; and have fever, cough, and shortness of breath within the last 2 weeks of travel OR  . Been in close contact with a person diagnosed with COVID-19 within the last 2 weeks and have fever, cough, and shortness of breath  . IF YOU DO NOT MEET THESE CRITERIA, YOU ARE CONSIDERED LOW RISK FOR COVID-19.   It is vitally important that if you feel that you have an infection such as this virus or any other virus that you stay home and away from places where you may spread it to others.  You should self-quarantine for 14 days if you have symptoms that could potentially be coronavirus and avoid contact with people age 1 and older.   You can use medication such as A prescription inhaler called Albuterol MDI 90 mcg /actuation 2 puffs every 4 hours as needed for shortness of breath, wheezing, cough.  Symptomatic treatment is recommended at this time to include, hydration, rest and to remain isolated at home.   You may also take  acetaminophen (Tylenol) as needed for fever.   Reduce your risk of any infection by using the same precautions used for avoiding the common cold or flu:  Marland Kitchen Wash your hands often with soap and warm water for at least 20 seconds.  If soap and water are not readily available, use an alcohol-based hand sanitizer with at least 60% alcohol.  . If coughing or sneezing, cover your mouth and nose by coughing or sneezing into the elbow areas of your shirt or coat, into a tissue or into your sleeve (not your hands). . Avoid shaking hands with others and consider head nods or verbal greetings only. . Avoid touching your eyes, nose, or mouth with unwashed hands.  . Avoid close contact with people who are sick. . Avoid places or events with large numbers of people in one location, like concerts or sporting events. . Carefully consider travel plans you have or are making. . If you are planning any travel outside or inside the [place], visit the CDC's Travelers' Health webpage for the latest health notices. . If you have some symptoms but not all symptoms, continue to monitor at home and seek medical attention if your symptoms worsen.  If you have shortness of breath, difficulty breathing, become unable to speak in complete sentences, you should go to the emergency department immediately. . If you are having a medical emergency, call 911.  HOME CARE . Only take medications as instructed by your medical  team. . Drink plenty of fluids and get plenty of rest. . A steam or ultrasonic humidifier can help if you have congestion.   GET HELP RIGHT AWAY IF: . You develop worsening fever. . You become short of breath . You cough up blood. . Your symptoms become more severe MAKE SURE YOU   Understand these instructions.  Will watch your condition.  Will get help right away if you are not doing well or get worse.  Your e-visit answers were reviewed by a board certified advanced clinical practitioner to complete your  personal care plan.  Depending on the condition, your plan could have included both over the counter or prescription medications.  If there is a problem please reply once you have received a response from your provider. Your safety is important to [place].  If you have drug allergies check your prescription carefully.    You can use MyChart to ask questions about today's visit, request a non-urgent call back, or ask for a work or school excuse for 24 hours related to this e-Visit. If it has been greater than 24 hours you will need to follow up with your provider, or enter a new e-Visit to address those concerns. You will get an e-mail in the next two days asking about your experience.  I hope that your e-visit has been valuable and will speed your recovery. Thank you for using e-visits.

## 2018-07-27 ENCOUNTER — Other Ambulatory Visit: Payer: Self-pay

## 2018-07-27 ENCOUNTER — Encounter (HOSPITAL_COMMUNITY): Payer: Self-pay | Admitting: Emergency Medicine

## 2018-07-27 ENCOUNTER — Emergency Department (HOSPITAL_COMMUNITY)
Admission: EM | Admit: 2018-07-27 | Discharge: 2018-07-27 | Disposition: A | Discharge status: Home / Self Care | Payer: BC Managed Care – PPO | Attending: Emergency Medicine | Admitting: Emergency Medicine

## 2018-07-27 ENCOUNTER — Emergency Department (HOSPITAL_COMMUNITY): Payer: BC Managed Care – PPO

## 2018-07-27 DIAGNOSIS — J069 Acute upper respiratory infection, unspecified: Secondary | ICD-10-CM | POA: Diagnosis not present

## 2018-07-27 DIAGNOSIS — R110 Nausea: Secondary | ICD-10-CM | POA: Diagnosis not present

## 2018-07-27 DIAGNOSIS — R05 Cough: Secondary | ICD-10-CM | POA: Insufficient documentation

## 2018-07-27 DIAGNOSIS — E119 Type 2 diabetes mellitus without complications: Secondary | ICD-10-CM | POA: Diagnosis not present

## 2018-07-27 DIAGNOSIS — Z79899 Other long term (current) drug therapy: Secondary | ICD-10-CM | POA: Insufficient documentation

## 2018-07-27 DIAGNOSIS — R0789 Other chest pain: Secondary | ICD-10-CM | POA: Insufficient documentation

## 2018-07-27 DIAGNOSIS — Z7984 Long term (current) use of oral hypoglycemic drugs: Secondary | ICD-10-CM | POA: Diagnosis not present

## 2018-07-27 DIAGNOSIS — R0602 Shortness of breath: Secondary | ICD-10-CM | POA: Diagnosis present

## 2018-07-27 DIAGNOSIS — J45909 Unspecified asthma, uncomplicated: Secondary | ICD-10-CM | POA: Insufficient documentation

## 2018-07-27 NOTE — ED Notes (Signed)
Patient transported to X-ray 

## 2018-07-27 NOTE — Discharge Instructions (Signed)
Your work-up in the emergency department has been reassuring.  We advise continued use of over-the-counter medications for symptom control.  Follow-up with your primary care doctor to ensure that symptoms resolved.  You may return to the ED for new or concerning symptoms.

## 2018-07-27 NOTE — ED Triage Notes (Signed)
Pt in with cough, sob and chest tightness intermittently x 1 wk. States she tested negative for Covid 4 days go. Per her doc, she was to got to ED if symptoms persisted. They have not resolved, and pt feels more sob and nauseous today. Temp 98.1, sats 99% RA

## 2018-07-27 NOTE — ED Notes (Signed)
Patient dropped sats from 98% to 95% while walking.  Recovered quickly after sitting down.  PA notified.

## 2018-07-27 NOTE — ED Provider Notes (Signed)
[facility] EMERGENCY DEPARTMENT Provider Note   CSN: [id number] Arrival date & time: 07/27/18  0425    History   Chief Complaint Chief Complaint  Patient presents with  . Shortness of Breath  . chest tightness  . Cough  . Nausea    HPI Kristi Jacobson is a 33 y.o. female.     33 year old female presents to the emergency department for evaluation of chest tightness and shortness of breath.  She states that she has been feeling under the weather for 1 week.  Symptoms associated with fatigue and malaise as well as diarrhea, sore throat, and dry, nonproductive cough.  Her shortness of breath has been waxing and waning in severity.  Was initially less frequent, but became more prominent today.  When patient felt short of breath, she notes tightness in her central chest.  No chest pain.  She initially felt that her symptoms may be related to anxiety so she tried to control her breathing.  Has a history of asthma, but states this feels different.  She did not use any inhalers prior to arrival and denies associated wheezing.  No fevers, vomiting, syncope, hemoptysis, leg swelling, use of OCPs, recent surgeries or hospitalizations.  Was seen 4 days ago at urgent care for symptom evaluation.  Tested negative for COVID at this time.  The history is provided by the patient. No language interpreter was used.  Shortness of Breath Associated symptoms: cough   Cough Associated symptoms: shortness of breath     Past Medical History:  Diagnosis Date  . Asthma   . Celiac disease   . Menorrhagia     Patient Active Problem List   Diagnosis Date Noted  . Gastroenteritis 01/06/2017  . T2DM (type 2 diabetes mellitus) ([place]) 01/06/2017  . Asthma   . Menorrhagia   . Celiac disease     Past Surgical History:  Procedure Laterality Date  . WISDOM TOOTH EXTRACTION       OB History    Gravida  0   Para  0   Term  0   Preterm  0   AB  0   Living  0     SAB  0    TAB  0   Ectopic  0   Multiple  0   Live Births  0            Home Medications    Prior to Admission medications   Medication Sig Start Date End Date Taking? Authorizing Provider  albuterol (PROVENTIL HFA;VENTOLIN HFA) 108 (90 Base) MCG/ACT inhaler Inhale 2 puffs into the lungs every 6 (six) hours as needed for wheezing or shortness of breath. 05/04/18   Kara Dies, NP  ELDERBERRY PO Take 1 tablet by mouth daily. chew    [provider]  glucose blood (ACCU-CHEK GUIDE) test strip Use as instructed to test once daily 02/14/17   Renato Shin, MD  Lancets (ACCU-CHEK MULTICLIX) lancets Use as instructed to test once daily 02/14/17   Renato Shin, MD  loratadine (CLARITIN) 10 MG tablet Take 10 mg by mouth daily.    [provider]  metFORMIN (GLUCOPHAGE-XR) 500 MG 24 hr tablet TAKE 1 TABLET BY MOUTH EVERY DAY WITH BREAKFAST Patient taking differently: Take 500 mg by mouth daily with breakfast.  10/11/17   Renato Shin, MD  UNABLE TO FIND Kombucha tea: Drink one to two times a week as needed for probiotic effect    [provider]  Vitamin  D, Ergocalciferol, (DRISDOL) 1.25 MG (50000 UT) CAPS capsule Take 1.25 Units by mouth once a week. 12/23/17   [provider]    Family History Family History  Problem Relation Age of Onset  . Diabetes Brother 30  . Congestive Heart Failure Brother   . Breast cancer Paternal Grandmother   . Skin cancer Mother   . Skin cancer Father   . Breast cancer Maternal Grandmother   . Hypertension Maternal Grandfather     Social History Social History   Tobacco Use  . Smoking status: Never Smoker  . Smokeless tobacco: Never Used  Substance Use Topics  . Alcohol use: No  . Drug use: No     Allergies   Cephalosporins, Penicillins, Gluten meal, Nyquil hbp cold & flu [dm-doxylamine-acetaminophen], Other, and Wheat bran   Review of Systems Review of Systems  Respiratory: Positive for cough and  shortness of breath.   Ten systems reviewed and are negative for acute change, except as noted in the HPI.    Physical Exam Updated Vital Signs BP 107/78   Pulse 65   Temp 98.1 F (36.7 C) (Oral)   Resp 13   Wt 86.2 kg   SpO2 98%   BMI 31.62 kg/m   Physical Exam Vitals signs and nursing note reviewed.  Constitutional:      General: She is not in acute distress.    Appearance: She is well-developed. She is not diaphoretic.     Comments: Nontoxic appearing and in NAD  HENT:     Head: Normocephalic and atraumatic.  Eyes:     General: No scleral icterus.    Conjunctiva/sclera: Conjunctivae normal.  Neck:     Musculoskeletal: Normal range of motion.  Cardiovascular:     Rate and Rhythm: Normal rate and regular rhythm.     Pulses: Normal pulses.  Pulmonary:     Effort: Pulmonary effort is normal. No respiratory distress.     Breath sounds: No stridor. No wheezing or rhonchi.     Comments: Lungs CTAB. Respirations even and unlabored. Musculoskeletal: Normal range of motion.     Comments: No BLE edema  Skin:    General: Skin is warm and dry.     Coloration: Skin is not pale.     Findings: No erythema or rash.  Neurological:     General: No focal deficit present.     Mental Status: She is alert and oriented to person, place, and time.     Coordination: Coordination normal.  Psychiatric:        Behavior: Behavior normal.      ED Treatments / Results  Labs (all labs ordered are listed, but only abnormal results are displayed) Labs Reviewed - No data to display  EKG EKG Interpretation  Date/Time:  Thursday July 27 2018 04:39:48 EDT Ventricular Rate:  94 PR Interval:    QRS Duration: 91 QT Interval:  354 QTC Calculation: 443 R Axis:   42 Text Interpretation:  Sinus rhythm Borderline T abnormalities, anterior leads similar to 12/2017 Confirmed by Marily MemosMesner, Jason [phone]([id number]) on 07/27/2018 4:57:04 AM   Radiology Dg Chest 2 View  Result Date: 07/27/2018 CLINICAL DATA:   Cough. Chest tightness. Symptoms for 1 week. Increased shortness of breath. EXAM: CHEST - 2 VIEW COMPARISON:  One-view chest x-ray 01/03/2018 FINDINGS: The heart size and mediastinal contours are within normal limits. Both lungs are clear. The visualized skeletal structures are unremarkable. IMPRESSION: Negative two view chest x-ray Electronically Signed   By: Cristal Deerhristopher  Mattern M.D.   On: 07/27/2018 05:47    Procedures Procedures (including critical care time)  Medications Ordered in ED Medications - No data to display   6:15 AM Patient maintain saturations at or above 95% with ambulation.   Initial Impression / Assessment and Plan / ED Course  I have reviewed the triage vital signs and the nursing notes.  Pertinent labs & imaging results that were available during my care of the patient were reviewed by me and considered in my medical decision making (see chart for details).        33 year old female presents to the emergency department for evaluation of chest tightness and shortness of breath.  While she admits to other associated symptoms consistent with uncomplicated URI, I believe the symptoms contributing to her ED presentation are more related to anxiety.  She describes a central chest tightness and shortness of breath which wax and wane in severity.  She ambulates in the emergency department without hypoxia.  Lungs clear to auscultation bilaterally.  No signs of respiratory distress.  X-ray without pneumothorax, pneumonia, pleural effusion.  Pulmonary embolus considered; however, patient is PERC negative.  Plan for outpatient primary care follow-up and supportive management with over-the-counter medications.  Return precautions discussed and provided. Patient discharged in stable condition with no unaddressed concerns.   Vitals:   07/27/18 0440 07/27/18 0445 07/27/18 0539 07/27/18 0545  BP: (!) 142/100  107/78   Pulse: 93  60 65  Resp: 14  12 13   Temp:  98.1 F (36.7 C)     TempSrc:  Oral    SpO2: 97%  98% 98%  Weight:  86.2 kg      Final Clinical Impressions(s) / ED Diagnoses   Final diagnoses:  Upper respiratory tract infection, unspecified type    ED Discharge Orders    None       Antony MaduraHumes, Camdan Burdi, PA-C 07/27/18 [id number]    Marily MemosMesner, Jason, MD 07/27/18 [phone]

## 2018-10-09 ENCOUNTER — Encounter: Payer: Self-pay | Admitting: Physician Assistant

## 2018-10-09 ENCOUNTER — Telehealth: Payer: BC Managed Care – PPO | Admitting: Physician Assistant

## 2018-10-09 DIAGNOSIS — R52 Pain, unspecified: Secondary | ICD-10-CM

## 2018-10-09 DIAGNOSIS — G4489 Other headache syndrome: Secondary | ICD-10-CM

## 2018-10-09 DIAGNOSIS — R197 Diarrhea, unspecified: Secondary | ICD-10-CM

## 2018-10-09 DIAGNOSIS — Z20822 Contact with and (suspected) exposure to covid-19: Secondary | ICD-10-CM

## 2018-10-09 NOTE — Progress Notes (Signed)
E-Visit for Corona Virus Screening   Your current symptoms could be consistent with the coronavirus.  Many health care providers can now test patients at their office but not all are.  [facility] has multiple testing sites. For information on our COVID testing locations and hours go to [email or web address]  Please quarantine yourself while awaiting your test results.  We are enrolling you in our MyChart Home Montioring for COVID19 . Daily you will receive a questionnaire within the MyChart website. Our COVID 19 response team willl be monitoriing your responses daily.   COVID 19 Testing Locations (Monday - Friday, 8 a.m. - 3:30 p.m.) [place]: [facility], [address], [place], [place]: [facility], [address] [facility] [address], [place], [place] (entrance off [address]) [place]: [address], [place], [place] (across from [facility])    COVID-19 is a respiratory illness with symptoms that are similar to the flu. Symptoms are typically mild to moderate, but there have been cases of severe illness and death due to the virus. The following symptoms may appear 2-14 days after exposure: . Fever . Cough . Shortness of breath or difficulty breathing . Chills . Repeated shaking with chills . Muscle pain . Headache . Sore throat . New loss of taste or smell . Fatigue . Congestion or runny nose . Nausea or vomiting . Diarrhea  It is vitally important that if you feel that you have an infection such as this virus or any other virus that you stay home and away from places where you may spread it to others.  You should self-quarantine for 14 days if you have symptoms that could potentially be coronavirus or have been in close contact a with a person diagnosed with COVID-19 within the last 2 weeks. You should avoid contact with people age 65 and older.   You should wear a mask  or cloth face covering over your nose and mouth if you must be around other people or animals, including pets (even at home). Try to stay at least 6 feet away from other people. This will protect the people around you.    You may also take acetaminophen (Tylenol) as needed for fever.  I have provided a work note    Reduce your risk of any infection by using the same precautions used for avoiding the common cold or flu:  . Wash your hands often with soap and warm water for at least 20 seconds.  If soap and water are not readily available, use an alcohol-based hand sanitizer with at least 60% alcohol.  . If coughing or sneezing, cover your mouth and nose by coughing or sneezing into the elbow areas of your shirt or coat, into a tissue or into your sleeve (not your hands). . Avoid shaking hands with others and consider head nods or verbal greetings only. . Avoid touching your eyes, nose, or mouth with unwashed hands.  . Avoid close contact with people who are sick. . Avoid places or events with large numbers of people in one location, like concerts or sporting events. . Carefully consider travel plans you have or are making. . If you are planning any travel outside or inside the [place], visit the CDC's Travelers' Health webpage for the latest health notices. . If you have some symptoms but not all symptoms, continue to monitor at home and seek medical attention if your symptoms worsen. . If you are having a medical emergency,   call 911.  HOME CARE . Only take medications as instructed by your medical team. . Drink plenty of fluids and get plenty of rest. . A steam or ultrasonic humidifier can help if you have congestion.   GET HELP RIGHT AWAY IF YOU HAVE EMERGENCY WARNING SIGNS** FOR COVID-19. If you or someone is showing any of these signs seek emergency medical care immediately. Call 911 or proceed to your closest emergency facility if: . You develop worsening high fever. . Trouble  breathing . Bluish lips or face . Persistent pain or pressure in the chest . New confusion . Inability to wake or stay awake . You cough up blood. . Your symptoms become more severe  **This list is not all possible symptoms. Contact your medical provider for any symptoms that are sever or concerning to you.   MAKE SURE YOU   Understand these instructions.  Will watch your condition.  Will get help right away if you are not doing well or get worse.  Your e-visit answers were reviewed by a board certified advanced clinical practitioner to complete your personal care plan.  Depending on the condition, your plan could have included both over the counter or prescription medications.  If there is a problem please reply once you have received a response from your provider.  Your safety is important to us.  If you have drug allergies check your prescription carefully.    You can use MyChart to ask questions about today's visit, request a non-urgent call back, or ask for a work or school excuse for 24 hours related to this e-Visit. If it has been greater than 24 hours you will need to follow up with your provider, or enter a new e-Visit to address those concerns. You will get an e-mail in the next two days asking about your experience.  I hope that your e-visit has been valuable and will speed your recovery. Thank you for using e-visits.   I spent 5-10 minutes on review and completion of this note- Gertude Benito PAC  

## 2019-02-13 IMAGING — DX DG CHEST 2V
2 series · 2 of 2 positions shown · non-contrast
Comparison: January 06, 2017

CLINICAL DATA: Cough

EXAM:
CHEST  2 VIEW

[chest lat]
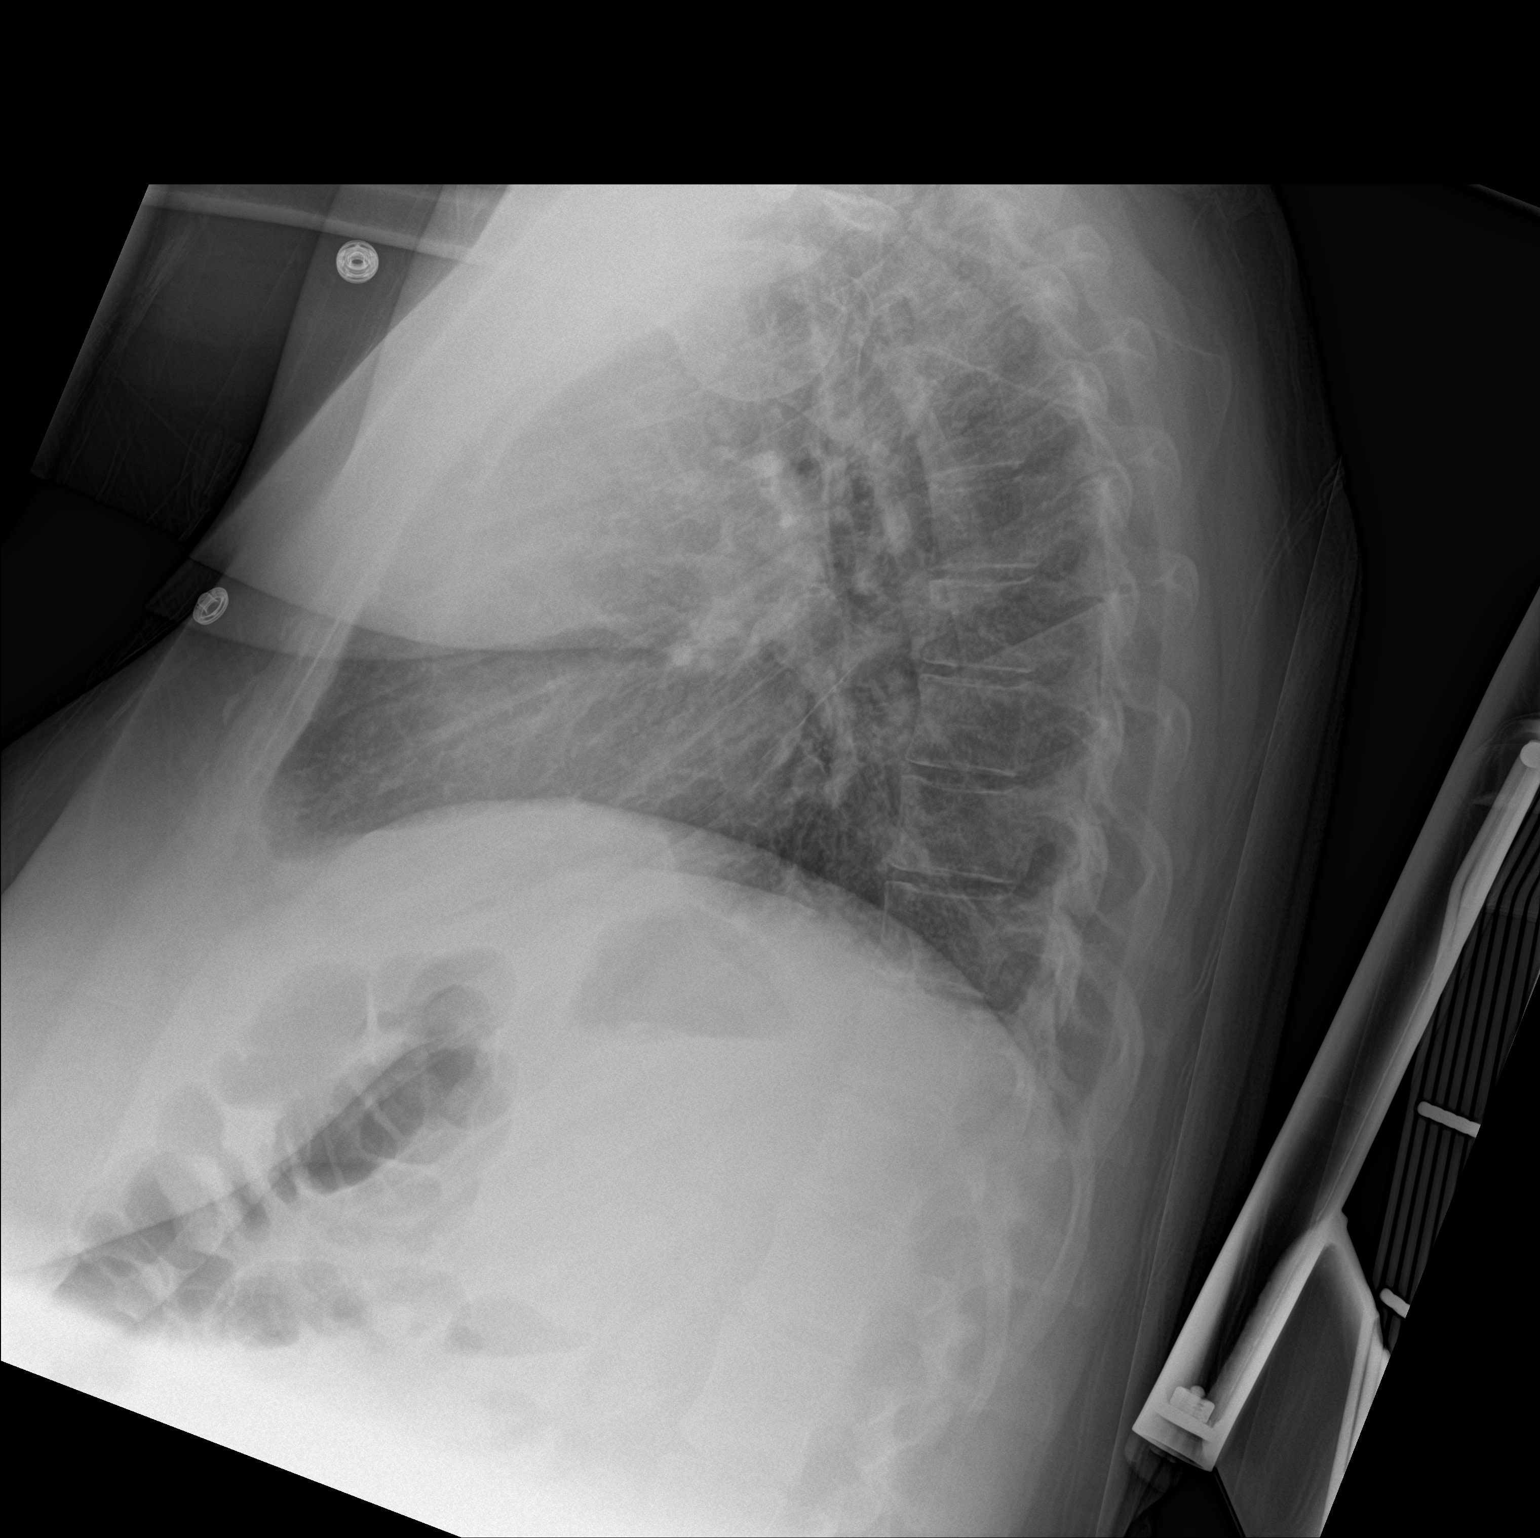

[chest ap]
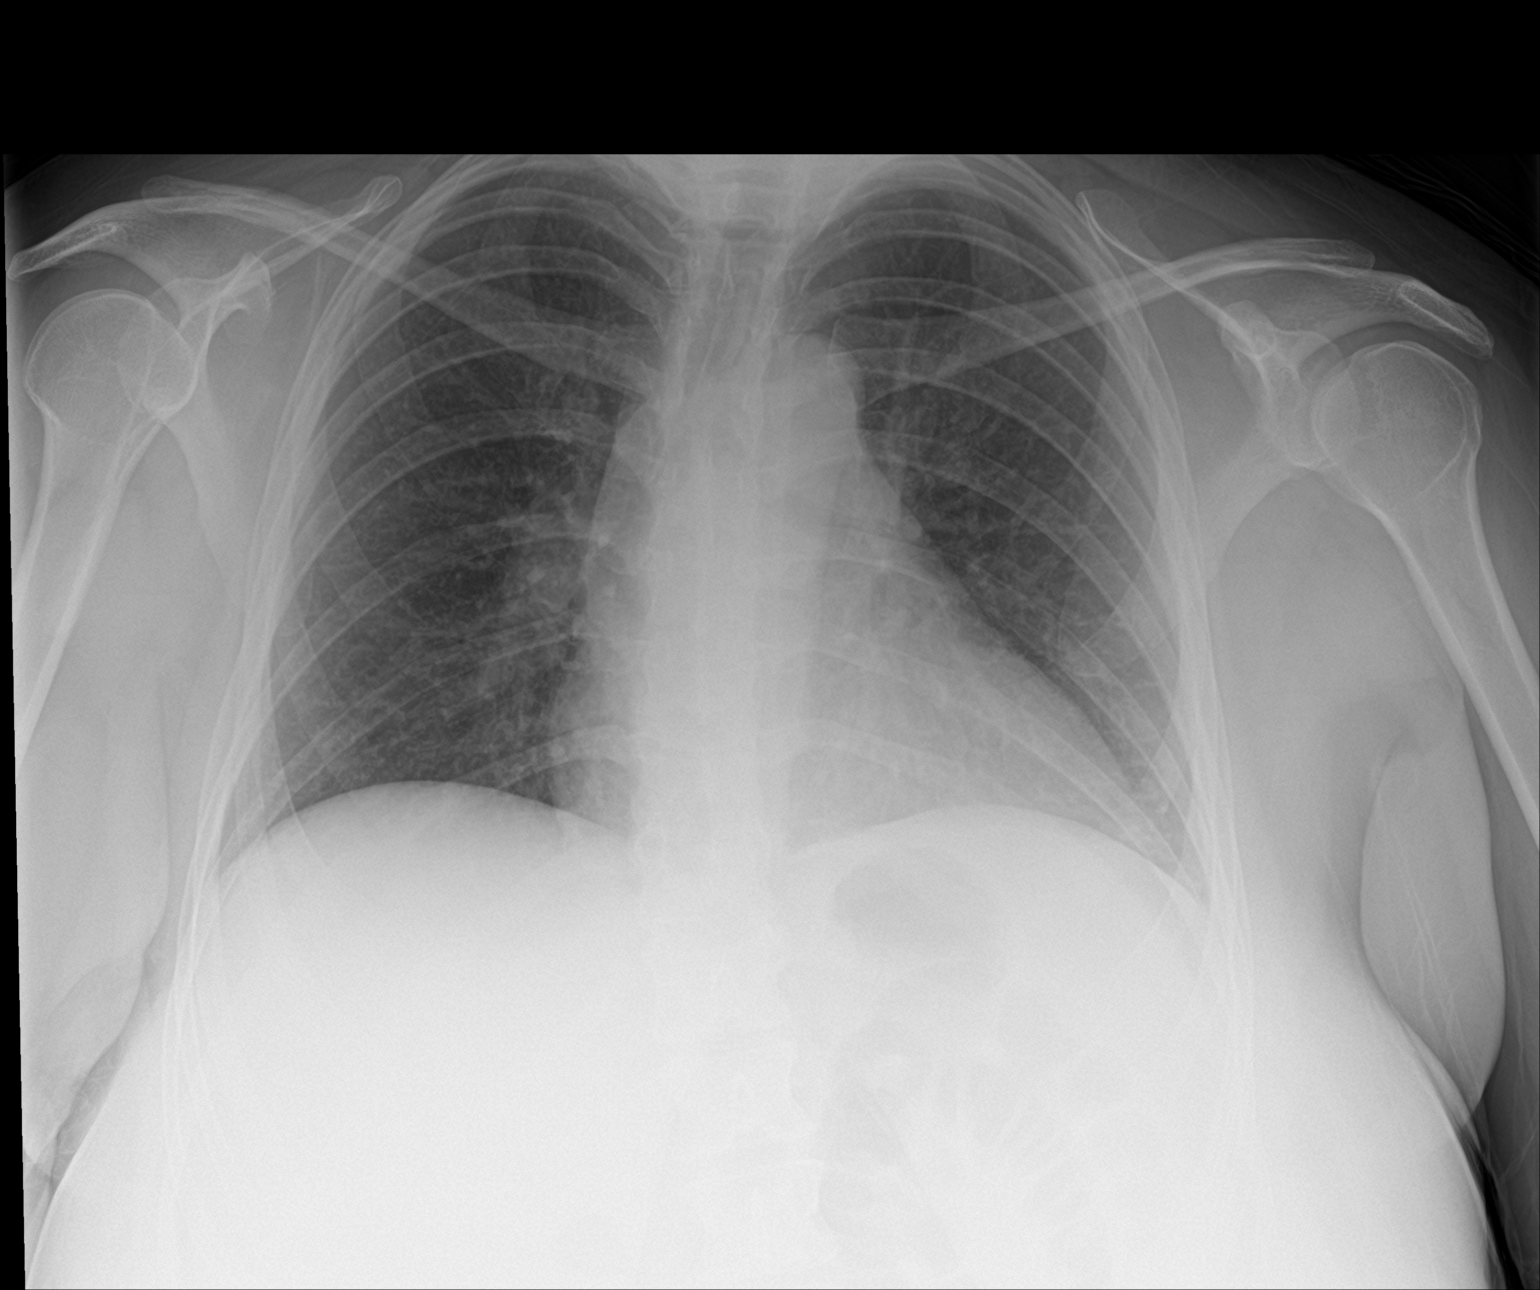

[2 of 2 positions shown; findings below may reference images not displayed]

FINDINGS: Lungs are clear. Heart size and pulmonary vascularity are normal. No
adenopathy. No evident bone lesions.
IMPRESSION: No edema or consolidation.

## 2019-03-02 ENCOUNTER — Ambulatory Visit (INDEPENDENT_AMBULATORY_CARE_PROVIDER_SITE_OTHER)
Admission: RE | Admit: 2019-03-02 | Discharge: 2019-03-02 | Disposition: A | Discharge status: Home / Self Care | Payer: BC Managed Care – PPO | Source: Ambulatory Visit

## 2019-03-02 ENCOUNTER — Other Ambulatory Visit: Payer: Self-pay

## 2019-03-02 DIAGNOSIS — J0100 Acute maxillary sinusitis, unspecified: Secondary | ICD-10-CM

## 2019-03-02 MED ORDER — DOXYCYCLINE HYCLATE 100 MG PO CAPS
100.0000 mg | ORAL_CAPSULE | Freq: Two times a day (BID) | ORAL | 0 refills | Status: AC
Start: 2019-03-02 — End: ?

## 2019-03-02 NOTE — Discharge Instructions (Signed)
Take the doxycycline as directed.  Additionally you can take over-the-counter ibuprofen and Mucinex as needed.    Follow-up with your primary care provider or come here to be seen in person if your symptoms are not improving.

## 2019-03-02 NOTE — ED Provider Notes (Signed)
Virtual Visit via Video Note:  Kristi Jacobson  initiated request for Telemedicine visit with [facility] team. I connected with Kristi Jacobson  on 03/02/2019 at 11:39 AM  for a synchronized telemedicine visit using a video enabled HIPPA compliant telemedicine application. I verified that I am speaking with [facility]  using two identifiers. Sharion Balloon, NP  was physically located in a [facility] Urgent care site and Azhar Yogi was located at a different location.   The limitations of evaluation and management by telemedicine as well as the availability of in-person appointments were discussed. Patient was informed that she  may incur a bill ( including co-pay) for this virtual visit encounter. Dalanie Whitmire  expressed understanding and gave verbal consent to proceed with virtual visit.     History of Present Illness:Kristi Jacobson  is a 34 y.o. female presents for evaluation of nasal congestion, sinus pressure, headache, low grade fever, green nasal discharge, bilateral ear pressure, cough productive of white-yellow phlegm x 5 days.  Tmax 100.0.  She denies rash, sore throat, shortness of breath, vomiting, diarrhea, or other symptoms.  Patient reports a negative COVID test on 02/27/2019.  She has been treating her symptoms with OTC allergy medication and Tylenol.  She denies pregnancy or breastfeeding; LMP: 3 weeks.    Allergies  Allergen Reactions  . Cephalosporins Anaphylaxis  . Penicillins Anaphylaxis    Has patient had a PCN reaction causing immediate rash, facial/tongue/throat swelling, SOB or lightheadedness with hypotension: Yes Has patient had a PCN reaction causing severe rash involving mucus membranes or skin necrosis: Yes Has patient had a PCN reaction that required hospitalization: Yes Has patient had a PCN reaction occurring within the last 10 years: Yes If all of the above answers are "NO", then may proceed with Cephalosporin use.   .  Gluten Meal Diarrhea, Nausea And Vomiting and Other (See Comments)    Has Celiac Disease: Severe stomach cramping, extreme exhaustion, and constipation also  . Nyquil Hbp Cold & Flu [Dm-Doxylamine-Acetaminophen] Other (See Comments)    One unknown ingredient caused hallucinations  . Other Diarrhea, Nausea And Vomiting and Other (See Comments)    Has Celiac Disease: NO BARLEY/NO RYE/NO SEEDS: Severe stomach cramping, extreme exhaustion, and constipation also  . Wheat Bran Diarrhea, Nausea And Vomiting and Other (See Comments)    Has Celiac Disease: Severe stomach cramping, extreme exhaustion, and constipation also     Past Medical History:  Diagnosis Date  . Asthma   . Celiac disease   . Menorrhagia      Social History   Tobacco Use  . Smoking status: Never Smoker  . Smokeless tobacco: Never Used  Substance Use Topics  . Alcohol use: No  . Drug use: No        Observations/Objective: Physical Exam   VITALS: Patient denies fever. GENERAL: Alert, appears well and in no acute distress. HEENT: Atraumatic. NECK: Normal movements of the head and neck. CARDIOPULMONARY: No increased WOB. Speaking in clear sentences. I:E ratio WNL.  MS: Moves all visible extremities without noticeable abnormality. PSYCH: Pleasant and cooperative, well-groomed. Speech normal rate and rhythm. Affect is appropriate. Insight and judgement are appropriate. Attention is focused, linear, and appropriate.  NEURO: CN grossly intact. Oriented as arrived to appointment on time with no prompting. Moves both UE equally.  SKIN: No obvious lesions, wounds, erythema, or cyanosis noted on face or hands.   Assessment and Plan:    ICD-10-CM   1. Acute non-recurrent maxillary sinusitis  J01.00        Follow Up Instructions: Treating with doxycycline.  Instructed patient to take over-the-counter ibuprofen and Mucinex as needed for her symptoms.  Instructed her to follow-up with her PCP or come here to be seen in  person if her symptoms are not improving.  Patient agrees to plan of care.      I discussed the assessment and treatment plan with the patient. The patient was provided an opportunity to ask questions and all were answered. The patient agreed with the plan and demonstrated an understanding of the instructions.   The patient was advised to call back or seek an in-person evaluation if the symptoms worsen or if the condition fails to improve as anticipated.      Mickie Bail, NP  03/02/2019 11:39 AM         Mickie Bail, NP 03/02/19 1140

## 2019-12-14 ENCOUNTER — Encounter (HOSPITAL_COMMUNITY): Payer: Self-pay | Admitting: Emergency Medicine

## 2019-12-14 ENCOUNTER — Ambulatory Visit (HOSPITAL_COMMUNITY): Payer: Self-pay

## 2019-12-14 ENCOUNTER — Emergency Department (HOSPITAL_COMMUNITY)
Admission: EM | Admit: 2019-12-14 | Discharge: 2019-12-14 | Disposition: A | Discharge status: Home / Self Care | Payer: BC Managed Care – PPO | Attending: Emergency Medicine | Admitting: Emergency Medicine

## 2019-12-14 ENCOUNTER — Emergency Department (HOSPITAL_BASED_OUTPATIENT_CLINIC_OR_DEPARTMENT_OTHER): Payer: BC Managed Care – PPO

## 2019-12-14 DIAGNOSIS — J45909 Unspecified asthma, uncomplicated: Secondary | ICD-10-CM | POA: Diagnosis not present

## 2019-12-14 DIAGNOSIS — Z7984 Long term (current) use of oral hypoglycemic drugs: Secondary | ICD-10-CM | POA: Insufficient documentation

## 2019-12-14 DIAGNOSIS — M79605 Pain in left leg: Secondary | ICD-10-CM | POA: Insufficient documentation

## 2019-12-14 DIAGNOSIS — E119 Type 2 diabetes mellitus without complications: Secondary | ICD-10-CM | POA: Insufficient documentation

## 2019-12-14 DIAGNOSIS — R0981 Nasal congestion: Secondary | ICD-10-CM | POA: Diagnosis not present

## 2019-12-14 DIAGNOSIS — F419 Anxiety disorder, unspecified: Secondary | ICD-10-CM | POA: Diagnosis not present

## 2019-12-14 DIAGNOSIS — M79609 Pain in unspecified limb: Secondary | ICD-10-CM | POA: Diagnosis not present

## 2019-12-14 DIAGNOSIS — R42 Dizziness and giddiness: Secondary | ICD-10-CM | POA: Diagnosis not present

## 2019-12-14 HISTORY — DX: Type 2 diabetes mellitus without complications: E11.9

## 2019-12-14 LAB — BASIC METABOLIC PANEL
Anion gap: 9 (ref 5–15)
BUN: 11 mg/dL (ref 6–20)
CO2: 24 mmol/L (ref 22–32)
Calcium: 9.9 mg/dL (ref 8.9–10.3)
Chloride: 106 mmol/L (ref 98–111)
Creatinine, Ser: 0.65 mg/dL (ref 0.44–1.00)
GFR, Estimated: >60 mL/min (ref >60)
Glucose, Bld: 119 mg/dL — ABNORMAL HIGH (ref 70–99)
Potassium: 4.3 mmol/L (ref 3.5–5.1)
Sodium: 139 mmol/L (ref 135–145)

## 2019-12-14 LAB — CBC WITH DIFFERENTIAL/PLATELET
Abs Immature Granulocytes: 0.04 10*3/uL (ref 0.00–0.07)
Basophils Absolute: 0.1 10*3/uL (ref 0.0–0.1)
Basophils Relative: 1 %
Eosinophils Absolute: 0.1 10*3/uL (ref 0.0–0.5)
Eosinophils Relative: 1 %
HCT: 40.8 % (ref 36.0–46.0)
Hemoglobin: 12.8 g/dL (ref 12.0–15.0)
Immature Granulocytes: 0 %
Lymphocytes Relative: 32 %
Lymphs Abs: 3.5 10*3/uL (ref 0.7–4.0)
MCH: 25.5 pg — ABNORMAL LOW (ref 26.0–34.0)
MCHC: 31.4 g/dL (ref 30.0–36.0)
MCV: 81.4 fL (ref 80.0–100.0)
Monocytes Absolute: 0.8 10*3/uL (ref 0.1–1.0)
Monocytes Relative: 7 %
Neutro Abs: 6.4 10*3/uL (ref 1.7–7.7)
Neutrophils Relative %: 59 %
Platelets: 285 10*3/uL (ref 150–400)
RBC: 5.01 MIL/uL (ref 3.87–5.11)
RDW: 14 % (ref 11.5–15.5)
WBC: 10.9 10*3/uL — ABNORMAL HIGH (ref 4.0–10.5)
nRBC: 0 % (ref 0.0–0.2)

## 2019-12-14 LAB — I-STAT BETA HCG BLOOD, ED (MC, WL, AP ONLY): I-stat hCG, quantitative: <5 m[IU]/mL (ref <5)

## 2019-12-14 LAB — CBG MONITORING, ED: Glucose-Capillary: 105 mg/dL — ABNORMAL HIGH (ref 70–99)

## 2019-12-14 NOTE — Discharge Instructions (Addendum)
Please return for any problem.   Follow-up with your regular care provider as instructed.  Symptoms described today could be consistent with early neuropathy.  Further evaluation by your regular care provider is required.

## 2019-12-14 NOTE — Progress Notes (Signed)
Left lower extremity venous duplex completed. Refer to "CV Proc" under chart review to view preliminary results.  12/14/2019 10:47 AM Eula Fried., MHA, RVT, RDCS, RDMS

## 2019-12-14 NOTE — ED Triage Notes (Addendum)
Pt arrives to ED with history left leg pain, she states this leg pain has been chronic and ongoing for years but recently seems worse. Pain in his left calf radiates into thigh. No redness or swelling. She also states over the last 1 year she has had reoccurring dizziness and has seen ENT Dr. Christain Sacramento a few weeks ago and he started her on Flonase which decreased the congestion she was experiencing but has not changed the dizzy spells. She reports once a day having an episode of feeling of "being on a boat." Often associated with a headache.

## 2019-12-14 NOTE — ED Provider Notes (Signed)
[facility] EMERGENCY DEPARTMENT Provider Note   CSN: [id number] Arrival date & time: 12/14/19  7510     History Chief Complaint  Patient presents with  . Leg Pain    Kristi Jacobson is a 34 y.o. female.  34 year old female with prior medical history as detailed below presents for evaluation.  Patient is complaining of multiple complaints.  Primary complaint is of left leg pain.  She describes dull aching pain from the knee distally.  This is been ongoing for several months if not longer.  She denies any specific inciting event.  She denies fever.  She denies redness of the skin.  She denies significant edema.  She is specifically concerned that she may have a DVT and request ultrasound to rule this out.    She does admit to difficulty managing her diabetes.  It is unclear what her blood sugars run at home.   She also complains of intermittent problems with anxiety.  She complains of sinus congestion.  This is improved with use of Flonase.  She reports intermittent dizziness associated with her congestion.  The history is provided by the patient and medical records.  Illness Location:  Left lower extremity pain, anxiety, sinus congestion, dizziness Severity:  Mild Onset quality:  Gradual Timing:  Constant Progression:  Waxing and waning Chronicity:  Chronic Associated symptoms: no chest pain, no fever and no shortness of breath        Past Medical History:  Diagnosis Date  . Asthma   . Celiac disease   . Diabetes mellitus without complication (HCC)   . Menorrhagia     Patient Active Problem List   Diagnosis Date Noted  . Gastroenteritis 01/06/2017  . T2DM (type 2 diabetes mellitus) (HCC) 01/06/2017  . Asthma   . Menorrhagia   . Celiac disease     Past Surgical History:  Procedure Laterality Date  . WISDOM TOOTH EXTRACTION       OB History    Gravida  0   Para  0   Term  0   Preterm  0   AB  0   Living  0     SAB  0    TAB  0   Ectopic  0   Multiple  0   Live Births  0           Family History  Problem Relation Age of Onset  . Diabetes Brother 30  . Congestive Heart Failure Brother   . Breast cancer Paternal Grandmother   . Skin cancer Mother   . Skin cancer Father   . Breast cancer Maternal Grandmother   . Hypertension Maternal Grandfather     Social History   Tobacco Use  . Smoking status: Never Smoker  . Smokeless tobacco: Never Used  Vaping Use  . Vaping Use: Never used  Substance Use Topics  . Alcohol use: No  . Drug use: No    Home Medications Prior to Admission medications   Medication Sig Start Date End Date Taking? Authorizing Provider  albuterol (PROVENTIL HFA;VENTOLIN HFA) 108 (90 Base) MCG/ACT inhaler Inhale 2 puffs into the lungs every 6 (six) hours as needed for wheezing or shortness of breath. 05/04/18   Benay Pike, NP  doxycycline (VIBRAMYCIN) 100 MG capsule Take 1 capsule (100 mg total) by mouth 2 (two) times daily. 03/02/19   Mickie Bail, NP  ELDERBERRY PO Take 1 tablet by mouth daily. chew    [provider]  glucose blood (ACCU-CHEK GUIDE) test strip Use as instructed to test once daily 02/14/17   Romero Belling, MD  Lancets (ACCU-CHEK MULTICLIX) lancets Use as instructed to test once daily 02/14/17   Romero Belling, MD  loratadine (CLARITIN) 10 MG tablet Take 10 mg by mouth daily.    [provider]  metFORMIN (GLUCOPHAGE-XR) 500 MG 24 hr tablet TAKE 1 TABLET BY MOUTH EVERY DAY WITH BREAKFAST Patient taking differently: Take 500 mg by mouth daily with breakfast.  10/11/17   Romero Belling, MD  UNABLE TO FIND Kombucha tea: Drink one to two times a week as needed for probiotic effect    [provider]  Vitamin D, Ergocalciferol, (DRISDOL) 1.25 MG (50000 UT) CAPS capsule Take 1.25 Units by mouth once a week. 12/23/17   [provider]    Allergies    Cephalosporins, Penicillins, Gluten meal, Nyquil hbp cold & flu  [dm-doxylamine-acetaminophen], Other, and Wheat bran  Review of Systems   Review of Systems  Constitutional: Negative for fever.  Respiratory: Negative for shortness of breath.   Cardiovascular: Negative for chest pain.  All other systems reviewed and are negative.   Physical Exam Updated Vital Signs BP (!) 140/92 (BP Location: Right Arm)   Pulse 69   Temp 98.4 F (36.9 C) (Oral)   Resp 16   Ht 5\' 5"  (1.651 m)   Wt 83.9 kg   SpO2 100%   BMI 30.79 kg/m   Physical Exam Vitals and nursing note reviewed.  Constitutional:      General: She is not in acute distress.    Appearance: Normal appearance. She is well-developed.  HENT:     Head: Normocephalic and atraumatic.  Eyes:     Conjunctiva/sclera: Conjunctivae normal.     Pupils: Pupils are equal, round, and reactive to light.  Cardiovascular:     Rate and Rhythm: Normal rate and regular rhythm.     Heart sounds: Normal heart sounds.  Pulmonary:     Effort: Pulmonary effort is normal. No respiratory distress.     Breath sounds: Normal breath sounds.  Abdominal:     General: There is no distension.     Palpations: Abdomen is soft.     Tenderness: There is no abdominal tenderness.  Musculoskeletal:        General: No swelling, tenderness, deformity or signs of injury. Normal range of motion.     Cervical back: Normal range of motion and neck supple.     Right lower leg: No edema.     Left lower leg: No edema.     Comments: Both lower extremities are neurovascular intact.  No erythema, edema, vascular compromise noted.  Skin:    General: Skin is warm and dry.  Neurological:     Mental Status: She is alert and oriented to person, place, and time.     ED Results / Procedures / Treatments   Labs (all labs ordered are listed, but only abnormal results are displayed) Labs Reviewed  BASIC METABOLIC PANEL - Abnormal; Notable for the following components:      Result Value   Glucose, Bld 119 (*)    All other components  within normal limits  CBC WITH DIFFERENTIAL/PLATELET - Abnormal; Notable for the following components:   WBC 10.9 (*)    MCH 25.5 (*)    All other components within normal limits  I-STAT BETA HCG BLOOD, ED ([facility], [facility], [facility] ONLY)  CBG MONITORING, ED    EKG None  Radiology VAS  US LOWER EXTREMITY VENOUS (DVT) ([facility] and [facility] 7a-7p)  Result Date: 12/14/2019  Lower Venous DVT Study Indications: Left leg pain x approximately 1 year.  Limitations: Poor ultrasound/tissue interface. Comparison Study: No prior study Performing Technologist: Gertie FeyMichelle Simonetti MHA, RDMS, RVT, RDCS  Examination Guidelines: A complete evaluation includes B-mode imaging, spectral Doppler, color Doppler, and power Doppler as needed of all accessible portions of each vessel. Bilateral testing is considered an integral part of a complete examination. Limited examinations for reoccurring indications may be performed as noted. The reflux portion of the exam is performed with the patient in reverse Trendelenburg.  +-----+---------------+---------+-----------+----------+--------------+ RIGHTCompressibilityPhasicitySpontaneityPropertiesThrombus Aging +-----+---------------+---------+-----------+----------+--------------+ CFV  Full           Yes      Yes                                 +-----+---------------+---------+-----------+----------+--------------+   +---------+---------------+---------+-----------+----------+--------------+ LEFT     CompressibilityPhasicitySpontaneityPropertiesThrombus Aging +---------+---------------+---------+-----------+----------+--------------+ CFV      Full           Yes      Yes                                 +---------+---------------+---------+-----------+----------+--------------+ SFJ      Full                                                        +---------+---------------+---------+-----------+----------+--------------+ FV Prox  Full                                                         +---------+---------------+---------+-----------+----------+--------------+ FV Mid   Full                                                        +---------+---------------+---------+-----------+----------+--------------+ FV DistalFull                                                        +---------+---------------+---------+-----------+----------+--------------+ PFV      Full                                                        +---------+---------------+---------+-----------+----------+--------------+ POP      Full           Yes      Yes                                 +---------+---------------+---------+-----------+----------+--------------+ PTV      Full                                                        +---------+---------------+---------+-----------+----------+--------------+  PERO     Full                                                        +---------+---------------+---------+-----------+----------+--------------+     Summary: RIGHT: - No evidence of common femoral vein obstruction.  LEFT: - There is no evidence of deep vein thrombosis in the lower extremity.  - No cystic structure found in the popliteal fossa.  *See table(s) above for measurements and observations.    Preliminary     Procedures Procedures (including critical care time)  Medications Ordered in ED Medications - No data to display  ED Course  I have reviewed the triage vital signs and the nursing notes.  Pertinent labs & imaging results that were available during my care of the patient were reviewed by me and considered in my medical decision making (see chart for details).    MDM Rules/Calculators/A&P                          MDM  Screen complete  Kristi Jacobson was evaluated in Emergency Department on 12/14/2019 for the symptoms described in the history of present illness. She was evaluated in the context of the global COVID-19 pandemic, which  necessitated consideration that the patient might be at risk for infection with the SARS-CoV-2 virus that causes COVID-19. Institutional protocols and algorithms that pertain to the evaluation of patients at risk for COVID-19 are in a state of rapid change based on information released by regulatory bodies including the CDC and federal and state organizations. These policies and algorithms were followed during the patient's care in the ED.  Patient presented for evaluation of multiple complaints.  Patient's work-up essentially was without significant acute pathology.  Ultrasound showed no evidence of DVT.  Screening labs are without significant abnormality.  Patient is reassured by these findings.  She does understand need for close follow-up with her regular care providers.  Patient's describe symptoms perhaps could be early signs of diabetes related neuropathy.  She is advised to obtain further work-up as an outpatient.  Importance of close follow-up is stressed.  Strict return precautions given and understood.   Final Clinical Impression(s) / ED Diagnoses Final diagnoses:  Left leg pain    Rx / DC Orders ED Discharge Orders    None       Wynetta Fines, MD 12/14/19 1118

## 2019-12-14 NOTE — ED Notes (Signed)
Patient verbalizes understanding of discharge instructions. Opportunity for questioning and answers were provided. Armband removed by staff, pt discharged from ED and ambulated to lobby to return home.   

## 2020-01-02 IMAGING — CR CHEST - 2 VIEW
2 series · 2 of 2 positions shown · non-contrast
Comparison: One-view chest x-ray 01/03/2018

CLINICAL DATA: Cough. Chest tightness. Symptoms for 1 week.
Increased shortness of breath.

EXAM:
CHEST - 2 VIEW

[chest pa]
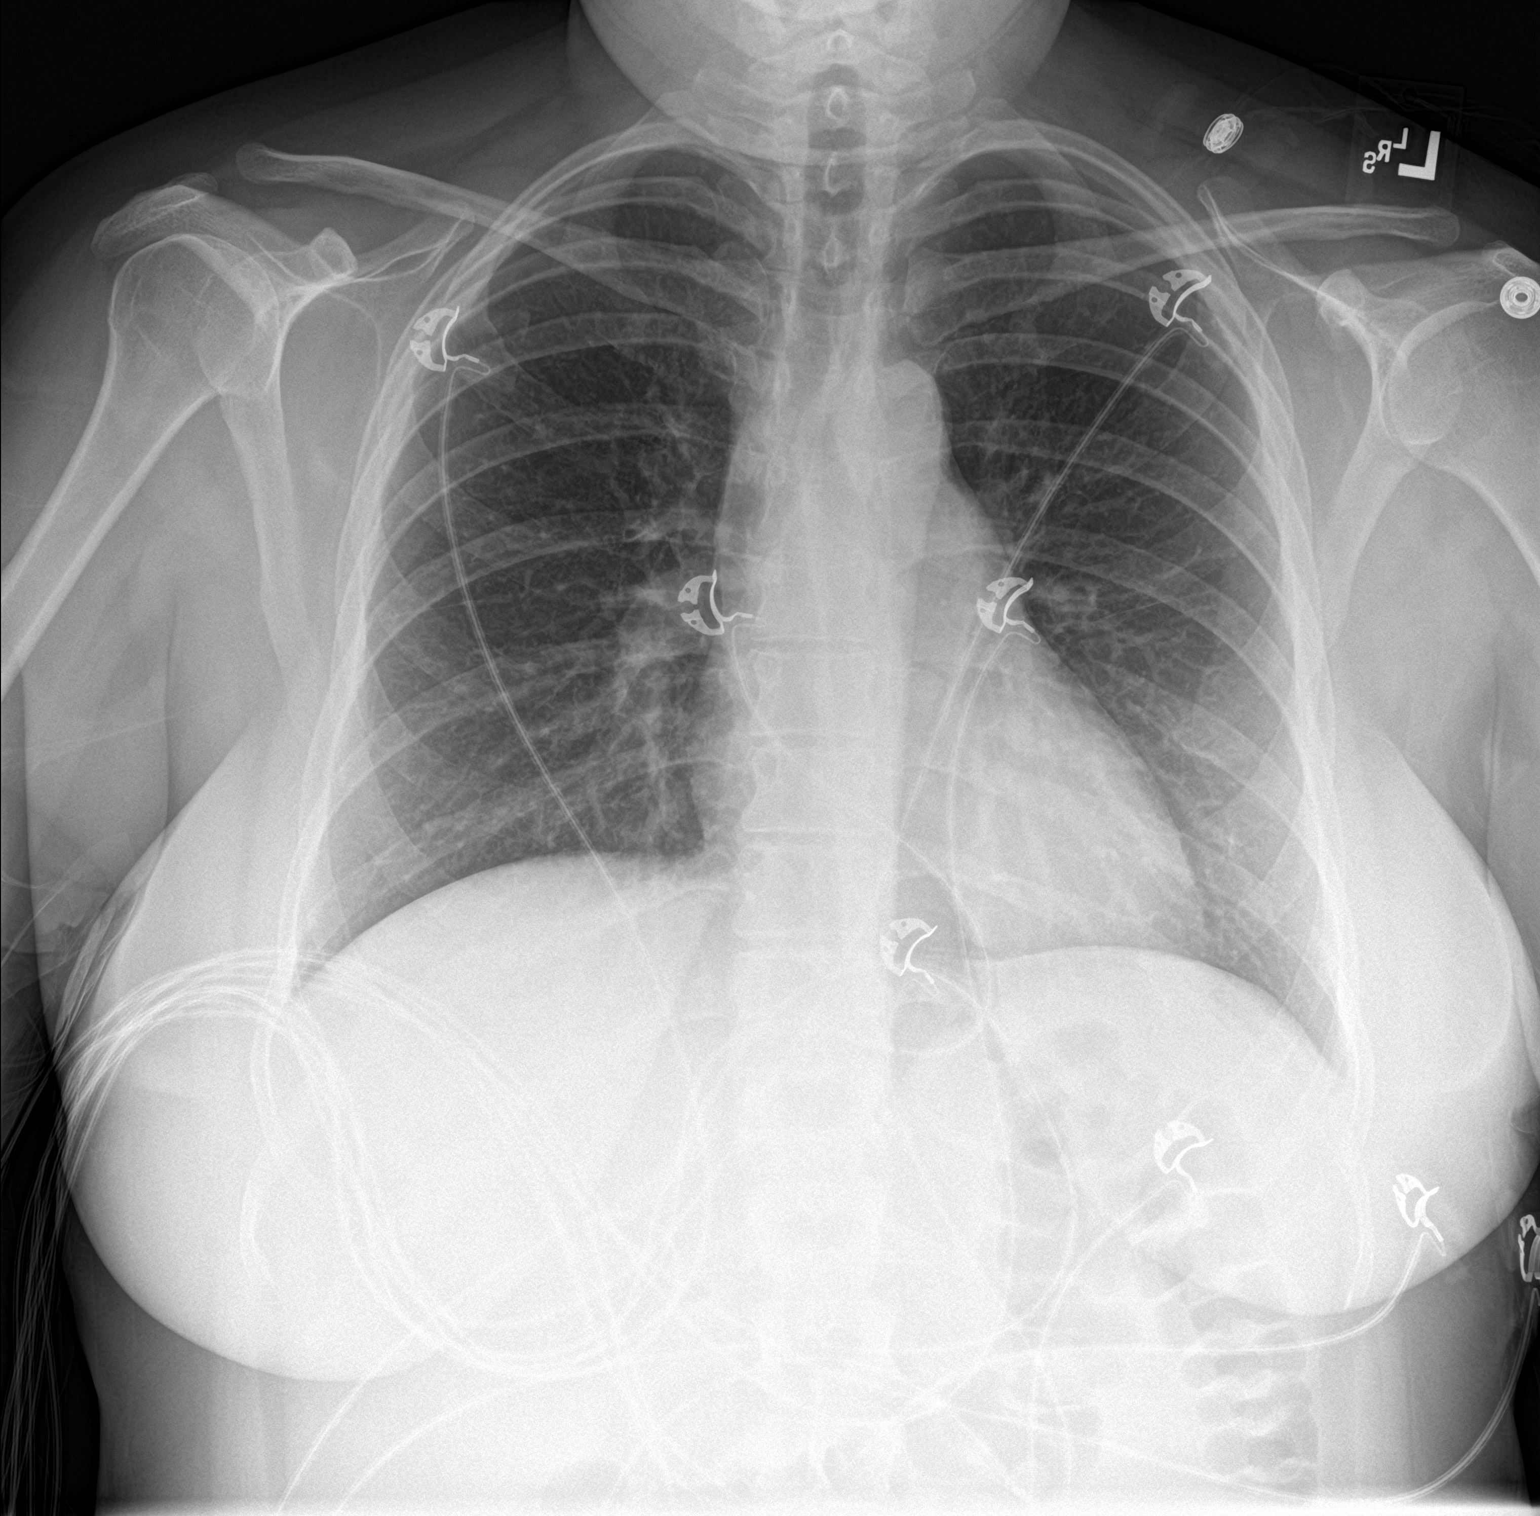

[chest lat]
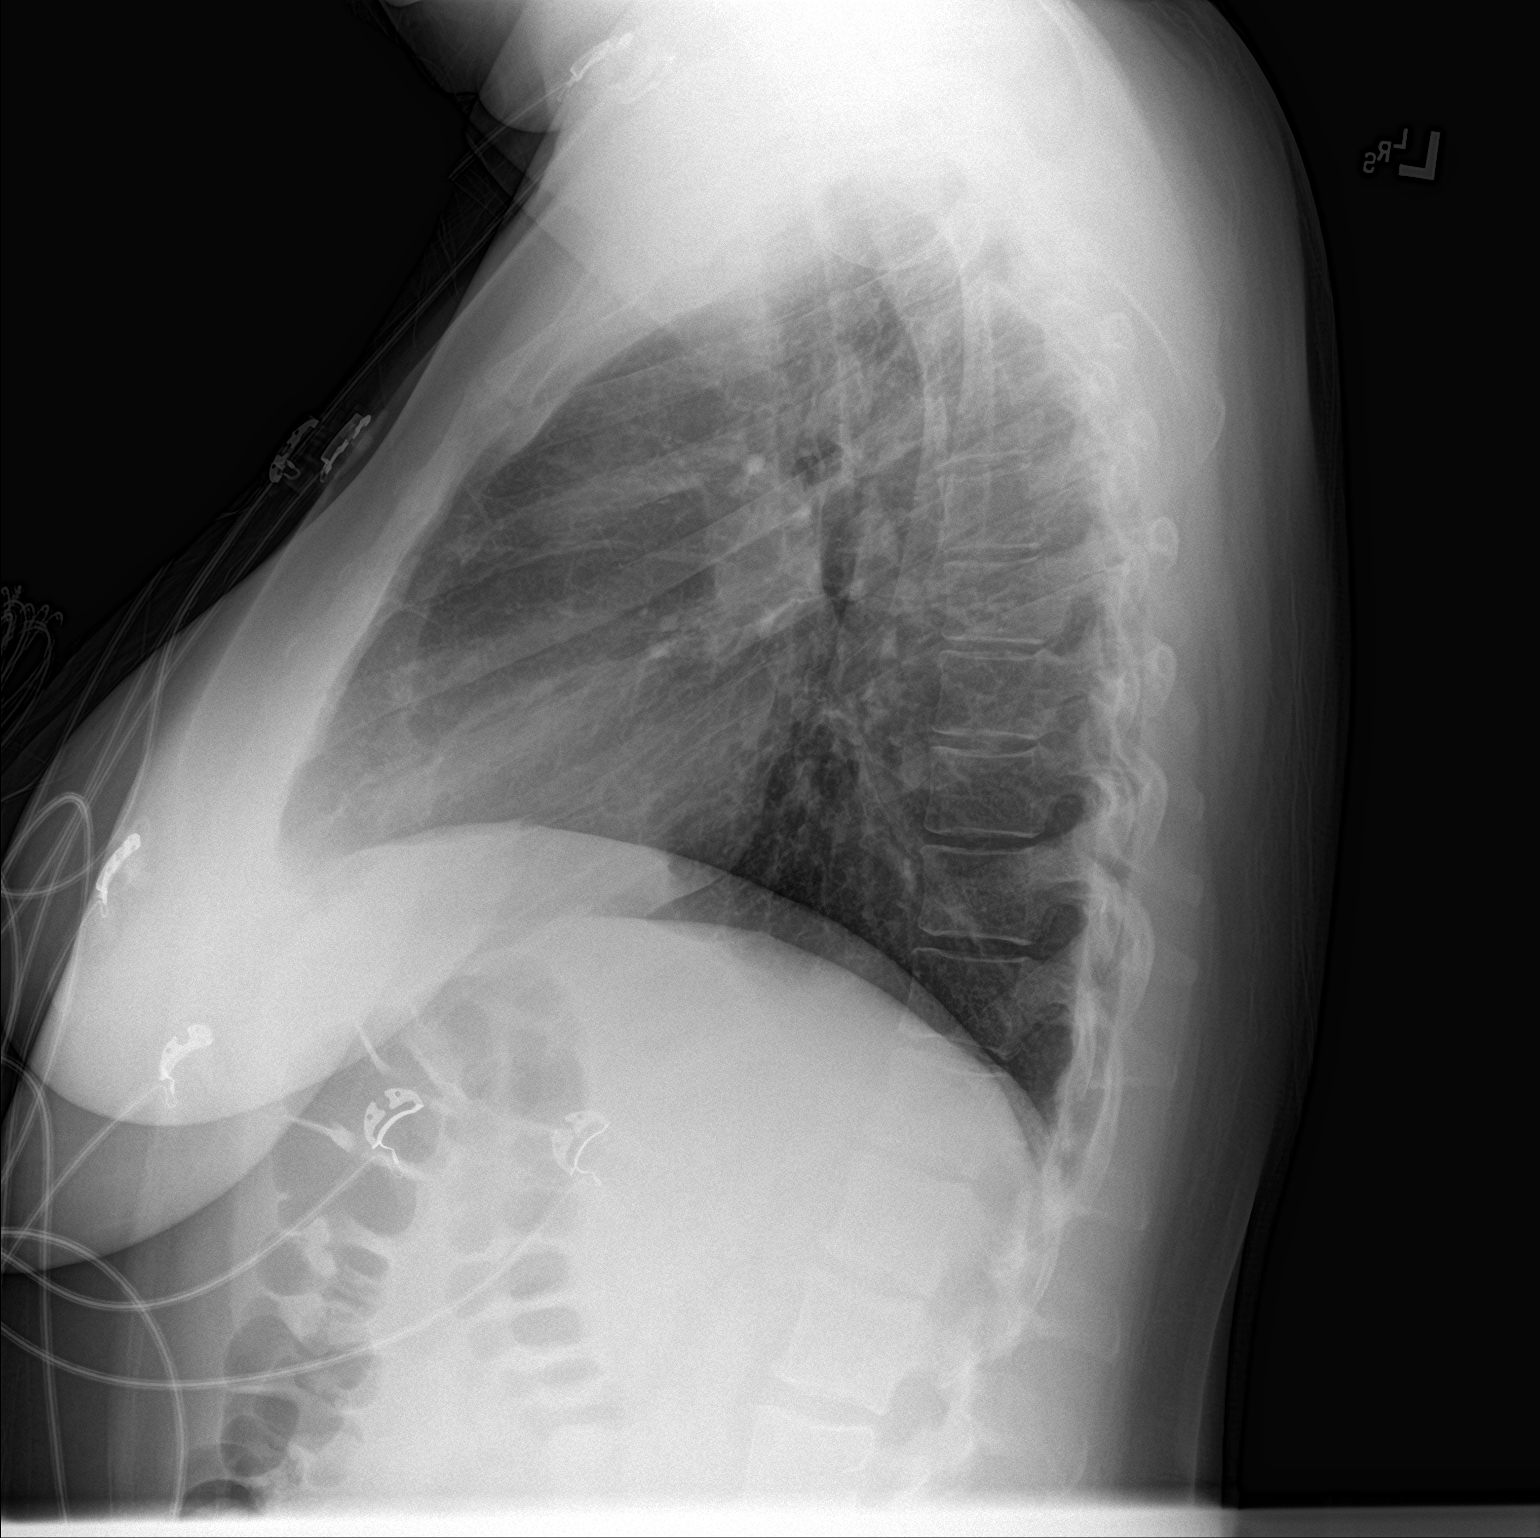

[2 of 2 positions shown; findings below may reference images not displayed]

FINDINGS: The heart size and mediastinal contours are within normal limits.
Both lungs are clear. The visualized skeletal structures are
unremarkable.
IMPRESSION: Negative two view chest x-ray

## 2020-12-09 DIAGNOSIS — Z9889 Other specified postprocedural states: Secondary | ICD-10-CM | POA: Insufficient documentation

## 2020-12-09 DIAGNOSIS — Z800 Family history of malignant neoplasm of digestive organs: Secondary | ICD-10-CM | POA: Insufficient documentation

## 2020-12-28 DIAGNOSIS — E282 Polycystic ovarian syndrome: Secondary | ICD-10-CM | POA: Insufficient documentation

## 2023-02-23 ENCOUNTER — Encounter (HOSPITAL_COMMUNITY): Payer: Self-pay | Admitting: *Deleted

## 2023-02-23 ENCOUNTER — Emergency Department (HOSPITAL_COMMUNITY): Payer: 59

## 2023-02-23 ENCOUNTER — Other Ambulatory Visit: Payer: Self-pay

## 2023-02-23 ENCOUNTER — Emergency Department (HOSPITAL_COMMUNITY)
Admission: EM | Admit: 2023-02-23 | Discharge: 2023-02-24 | Discharge status: Against Medical Advice | Payer: 59 | Attending: Emergency Medicine | Admitting: Emergency Medicine

## 2023-02-23 DIAGNOSIS — Z5321 Procedure and treatment not carried out due to patient leaving prior to being seen by health care provider: Secondary | ICD-10-CM | POA: Diagnosis not present

## 2023-02-23 DIAGNOSIS — R519 Headache, unspecified: Secondary | ICD-10-CM | POA: Diagnosis present

## 2023-02-23 DIAGNOSIS — E119 Type 2 diabetes mellitus without complications: Secondary | ICD-10-CM | POA: Insufficient documentation

## 2023-02-23 DIAGNOSIS — R079 Chest pain, unspecified: Secondary | ICD-10-CM | POA: Insufficient documentation

## 2023-02-23 DIAGNOSIS — I10 Essential (primary) hypertension: Secondary | ICD-10-CM | POA: Insufficient documentation

## 2023-02-23 LAB — TROPONIN I (HIGH SENSITIVITY): Troponin I (High Sensitivity): 2 ng/L

## 2023-02-23 LAB — TSH: TSH: 1.902 u[IU]/mL (ref 0.350–4.500)

## 2023-02-23 LAB — CBC
HCT: 39.9 % (ref 36.0–46.0)
Hemoglobin: 13 g/dL (ref 12.0–15.0)
MCH: 25.3 pg — ABNORMAL LOW (ref 26.0–34.0)
MCHC: 32.6 g/dL (ref 30.0–36.0)
MCV: 77.8 fL — ABNORMAL LOW (ref 80.0–100.0)
Platelets: 324 10*3/uL (ref 150–400)
RBC: 5.13 MIL/uL — ABNORMAL HIGH (ref 3.87–5.11)
RDW: 14.2 % (ref 11.5–15.5)
WBC: 14 10*3/uL — ABNORMAL HIGH (ref 4.0–10.5)
nRBC: 0 % (ref 0.0–0.2)

## 2023-02-23 LAB — BASIC METABOLIC PANEL WITH GFR
Anion gap: 10 (ref 5–15)
BUN: 11 mg/dL (ref 6–20)
CO2: 25 mmol/L (ref 22–32)
Calcium: 9.9 mg/dL (ref 8.9–10.3)
Chloride: 105 mmol/L (ref 98–111)
Creatinine, Ser: 0.71 mg/dL (ref 0.44–1.00)
GFR, Estimated: >60 mL/min
Glucose, Bld: 93 mg/dL (ref 70–99)
Potassium: 4 mmol/L (ref 3.5–5.1)
Sodium: 140 mmol/L (ref 135–145)

## 2023-02-23 LAB — D-DIMER, QUANTITATIVE: D-Dimer, Quant: <0.27 ug{FEU}/mL (ref 0.00–0.50)

## 2023-02-23 NOTE — ED Triage Notes (Signed)
 The pt saw her doctor earlier today and her bp was high and she expected it to go down as usual  but it did not

## 2023-02-23 NOTE — ED Provider Triage Note (Signed)
Emergency Medicine Provider Triage Evaluation Note  Kristi Jacobson , a 38 y.o. female  was evaluated in triage.  Pt complains of hypertension, racing heart.  History of diabetes.  Seen at PCP today..  Review of Systems  Positive: Racing heart Negative: fever  Physical Exam  BP (!) 148/108   Pulse (!) 124   Temp 98.4 F (36.9 C)   Resp 18   Ht 5\' 5"  (1.651 m)   Wt 83.9 kg   LMP 02/21/2023   SpO2 99%   BMI 30.78 kg/m  Gen:   Awake, no distress   Resp:  Normal effort  MSK:   Moves extremities without difficulty  Other:    Medical Decision Making  Medically screening exam initiated at 8:55 PM.  Appropriate orders placed.  Kristi Jacobson was informed that the remainder of the evaluation will be completed by another provider, this initial triage assessment does not replace that evaluation, and the importance of remaining in the ED until their evaluation is complete.     Kristi Captain, PA-C 02/23/23 2057

## 2023-02-23 NOTE — ED Triage Notes (Signed)
 The pt reports that she has a headache chest  pain and hypertension since this am

## 2023-02-24 LAB — TROPONIN I (HIGH SENSITIVITY): Troponin I (High Sensitivity): <2 ng/L (ref <18)

## 2023-02-24 NOTE — ED Notes (Signed)
Pt stated she will go home and return if anything changes. Pt seen leaving the ED.

## 2023-03-12 DIAGNOSIS — F840 Autistic disorder: Secondary | ICD-10-CM

## 2023-03-12 HISTORY — DX: Autistic disorder: F84.0

## 2023-06-02 LAB — LAB REPORT - SCANNED
A1c: 6.2
EGFR: 121
Free T4: 1.2 ng/dL
TSH: 1.07 (ref 0.41–5.90)

## 2023-12-14 ENCOUNTER — Ambulatory Visit: Attending: Internal Medicine | Admitting: Internal Medicine

## 2023-12-14 ENCOUNTER — Encounter: Payer: Self-pay | Admitting: Internal Medicine

## 2023-12-14 VITALS — BP 107/75 | HR 80 | Temp 97.8°F | Resp 16 | Ht 64.5 in | Wt 191.8 lb

## 2023-12-14 DIAGNOSIS — M222X1 Patellofemoral disorders, right knee: Secondary | ICD-10-CM | POA: Diagnosis not present

## 2023-12-14 DIAGNOSIS — R7689 Other specified abnormal immunological findings in serum: Secondary | ICD-10-CM

## 2023-12-14 DIAGNOSIS — K900 Celiac disease: Secondary | ICD-10-CM

## 2023-12-14 DIAGNOSIS — M222X2 Patellofemoral disorders, left knee: Secondary | ICD-10-CM | POA: Diagnosis not present

## 2023-12-14 DIAGNOSIS — F419 Anxiety disorder, unspecified: Secondary | ICD-10-CM | POA: Insufficient documentation

## 2023-12-14 DIAGNOSIS — Z13228 Encounter for screening for other metabolic disorders: Secondary | ICD-10-CM | POA: Insufficient documentation

## 2023-12-14 DIAGNOSIS — M222X9 Patellofemoral disorders, unspecified knee: Secondary | ICD-10-CM | POA: Insufficient documentation

## 2023-12-14 NOTE — Patient Instructions (Addendum)
 Consider adding exercises designed for POTS to improve strength while addressing vascular dysregulation from the standinguptopots.org website:  Http://peterson-powell.net/  Also quandriceps training has multiple effective options: Quadriceps stretch, prone  Lie on your abdomen on a firm surface, such as a bed or padded floor. Bend your left / right knee and hold your ankle. If you cannot reach your ankle or pant leg, loop a belt around your foot and grab the belt instead. Gently pull your heel toward your buttocks. Your knee should not slide out to the side. You should feel a stretch in the front of your thigh and knee (quadriceps). Hold this position for __________ seconds. Repeat __________ times. Complete this exercise __________ times a day. Straight leg raises This exercise strengthens the muscles in front of your thigh (quadriceps) and the muscles that move your hips (hip flexors). Lie on your back with your left / right leg extended and your other knee bent. Tense the muscles in the front of your left / right thigh. You should see your kneecap slide up or see increased dimpling just above the knee. Your thigh may even shake a bit. Keep these muscles tight as you raise your leg 4-6 inches (10-15 cm) off the floor. Do not let your knee bend. Hold this position for __________ seconds. Keep these muscles tense as you lower your leg. Relax your muscles slowly and completely after each repetition. Repeat __________ times. Complete this exercise __________ times a day. Squats This exercise strengthens the muscles in front of your thigh and knee (quadriceps). Stand in front of a table, with your feet and knees pointing straight ahead. You may rest your hands on the table for balance but not for support. Slowly bend your knees and lower your hips like you are going to sit in a chair. Keep your weight over your heels,  not over your toes. Keep your lower legs upright so they are parallel with the table legs. Do not let your hips go lower than your knees. Do not bend lower than told by your health care provider. If your knee pain increases, do not bend as low. Hold the squat position for __________ seconds. Slowly push with your legs to return to standing. Do not use your hands to pull yourself to standing. Repeat __________ times. Complete this exercise __________ times a day. Wall slides This exercise strengthens the muscles in front of your thigh and knee (quadriceps). Lean your back against a smooth wall or door, and walk your feet out 18-24 inches (46-61 cm) from it. Place your feet hip-width apart. Slowly slide down the wall or door until your knees bend __________ degrees. Keep your knees over your heels, not over your toes. Keep your knees in line with your hips. Hold this position for __________ seconds. Repeat __________ times. Complete this exercise __________ times a day. Straight leg raises, side-lying This exercise strengthens the muscles that rotate the leg at the hip and move it away from your body (hip abductors). Lie on your side with your left / right leg in the top position. Lie so your head, shoulder, knee, and hip line up. You may bend your bottom knee to help you keep your balance. Roll your hips slightly forward so your hips are stacked directly over each other and your left / right knee is facing forward. Leading with your heel, lift your top leg 4-6 inches (10-15 cm). You should feel the muscles in your outer hip lifting. Do not let your foot drift  forward. Do not let your knee roll toward the ceiling. Hold this position for __________ seconds. Slowly return your leg to the starting position. Let your muscles relax completely after each repetition. Repeat __________ times. Complete this exercise __________ times a day.

## 2023-12-14 NOTE — Progress Notes (Signed)
 Office Visit Note  Patient: Kristi Jacobson             Date of Birth: 1985-05-13           MRN: [id number]             PCP: Roy Harlene HERO, NP Referring: Sebastian Jerilyn HERO, * Visit Date: 12/14/2023 Occupation: Customer Service  Subjective:  New Patient (Initial Visit) and Joint Pain (Most noticeable in the hands)   Discussed the use of AI scribe software for clinical note transcription with the patient, who gave verbal consent to proceed.  History of Present Illness   Kristi Jacobson is a 38 year old female with celiac disease who presents with joint pain, swelling, and fatigue. She was referred by her primary care physician due to abnormal lab results, including a positive ANA and blood count abnormalities, along with symptoms of joint pain, swelling, lightheadedness, fatigue, and skin changes.  She has been experiencing joint pain, swelling, lightheadedness, fatigue, and skin changes since November of last year, with symptoms peaking in severity until April of this year. During this period, she was under significant work-related stress, which she believes may have exacerbated her symptoms. She has adhered to a gluten-free diet for 14 years, which has significantly improved her celiac disease symptoms.  She experiences bouts of presyncope characterized by a sudden spike in heart rate and palpitations. Compression socks and propranolol have been effective in managing these symptoms. She has a history of visiting cardiologists due to these symptoms, often resulting in ER visits due to concerns of a heart attack, but tests have consistently shown normal results. Dizziness occurs when standing for long periods or when exposed to heat, and her legs become red and splotchy, which is alleviated by compression socks.  She describes experiencing numbness and tingling in her extremities, which occurs sporadically without a clear trigger. She also reports a history of anxiety and panic  attacks, which have been lifelong issues. Propranolol has been beneficial in managing her anxiety symptoms.  She notes skin changes, including random bruising on her legs and arms without a clear cause. She has not experienced abnormal bleeding. She has been diligent about masking and working from home to avoid illness, and has not been seriously ill in the past year. However, she has a history of frequent illnesses prior to adopting these precautions.  She reports significant bloating after eating, regardless of the type of food consumed. She is not aware of any formal diagnoses of autoimmune conditions among her relatives, though she notes that some family members have had symptoms that could be suspicious for such conditions.  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 This episode was severe, spreading to her abdomen and causing joint immobility.       Activities of Daily Living:  Patient reports morning stiffness for 30 minutes.   Patient Reports nocturnal pain.  Difficulty dressing/grooming: Denies Difficulty climbing stairs: Denies Difficulty getting out of chair: Denies Difficulty using hands for taps, buttons, cutlery, and/or writing:   Review of Systems  Constitutional:  Positive for fatigue.  HENT:  Positive for mouth dryness. Negative for mouth sores.   Eyes:  Positive for dryness.  Respiratory:  Negative for shortness of breath.   Cardiovascular:  Positive for chest pain and palpitations.  Gastrointestinal:  Positive for constipation and diarrhea. Negative for blood in stool.  Endocrine: Positive for increased urination.  Genitourinary:  Positive for involuntary urination.  Musculoskeletal:  Positive for joint pain, gait problem, joint pain, myalgias, muscle weakness, morning stiffness and myalgias. Negative for joint swelling and muscle tenderness.  Skin:  Positive for hair loss and sensitivity  to sunlight. Negative for color change and rash.  Allergic/Immunologic: Positive for susceptible to infections.  Neurological:  Positive for dizziness and headaches.  Hematological:  Positive for swollen glands.  Psychiatric/Behavioral:  Positive for depressed mood and sleep disturbance. The patient is nervous/anxious.     PMFS History:  Patient Active Problem List   Diagnosis Date Noted   Anxiety 12/14/2023   Encounter for screening for other metabolic disorders 12/14/2023   Positive ANA (antinuclear antibody) 12/14/2023   Patellofemoral pain syndrome 12/14/2023   PCOS (polycystic ovarian syndrome) 12/28/2020   Family history of colon cancer 12/09/2020   History of esophagogastroduodenoscopy (EGD) 12/09/2020   Gastroenteritis 01/06/2017   T2DM (type 2 diabetes mellitus) (HCC) 01/06/2017   Asthma    Menorrhagia    Celiac disease     Past Medical History:  Diagnosis Date   Asthma    Autism disorder 03/2023   Celiac disease    Diabetes mellitus without complication (HCC)    Menorrhagia    PCOS (polycystic ovarian syndrome) 2002    Family History  Problem Relation Age of Onset   Diabetes Brother 30   Congestive Heart Failure Brother    Breast cancer Paternal Grandmother    Skin cancer Mother    Skin cancer Father    Breast cancer Maternal Grandmother    Hypertension Maternal Grandfather    Past Surgical History:  Procedure Laterality Date   WISDOM TOOTH EXTRACTION     Social History   Tobacco Use   Smoking status: Never    Passive exposure: Past   Smokeless tobacco: Never  Vaping Use   Vaping status: Never Used  Substance Use Topics   Alcohol use: No   Drug use: No   Social History   Social History Narrative   Lives in [place]. Lives alone.      There is no immunization history on file for this patient.   Objective: Vital Signs: BP 107/75 (BP Location: Left Arm, Patient Position: Sitting, Cuff Size: Normal)   Pulse 80   Temp 97.8 F (36.6 C)    Resp 16   Ht 5' 4.5 (1.638 m)   Wt 191 lb 12.8 oz (87 kg)   LMP 11/27/2023   BMI 32.41 kg/m    Physical Exam Eyes:     Conjunctiva/sclera: Conjunctivae normal.  Cardiovascular:     Rate and Rhythm: Normal rate and regular rhythm.  Pulmonary:     Effort: Pulmonary effort is normal.     Breath sounds: Normal breath sounds.  Lymphadenopathy:     Cervical: No cervical adenopathy.  Skin:    General: Skin is warm and dry.     Comments: Facial erythema and telangiectasias  Neurological:     Mental Status: She is alert.  Psychiatric:        Mood and Affect: Mood normal.      Musculoskeletal Exam:  Neck full ROM no tenderness Shoulders full ROM no tenderness or swelling Elbows full ROM no tenderness or swelling Wrists full ROM no tenderness or swelling Fingers full ROM no tenderness or swelling No paraspinal tenderness to palpation over upper and lower back Hip internal rotation limited with soft endpoint, no focal tenderness to pressure Knees full ROM no tenderness or swelling, patellofemoral crepitus Ankles full ROM  no tenderness or swelling       Investigation: No additional findings.  Imaging: No results found.  Recent Labs: Lab Results  Component Value Date   WBC 14.0 (H) 02/23/2023   HGB 13.0 02/23/2023   PLT 324 02/23/2023   NA 140 02/23/2023   K 4.0 02/23/2023   CL 105 02/23/2023   CO2 25 02/23/2023   GLUCOSE 93 02/23/2023   BUN 11 02/23/2023   CREATININE 0.71 02/23/2023   BILITOT 0.4 01/18/2017   ALKPHOS 61 01/18/2017   AST 22 01/18/2017   ALT 27 01/18/2017   PROT 7.8 01/18/2017   ALBUMIN 4.1 01/18/2017   CALCIUM 9.9 02/23/2023   GFRAA >60 01/03/2018    Speciality Comments: No specialty comments available.  Procedures:  No procedures performed Allergies: Cephalosporins, Penicillins, Azithromycin , Gluten meal, Nyquil hbp cold & flu [dm-doxylamine-acetaminophen ], Other, and Wheat   Assessment / Plan:     Visit Diagnoses: Positive ANA  (antinuclear antibody) - Plan: RNP Antibody, Anti-Smith antibody, Sjogrens syndrome-A extractable nuclear antibody, Anti-DNA antibody, double-stranded, C3 and C4, Sedimentation rate, C-reactive protein Chronic joint pain, particularly in finger joints. Differential includes autoimmune conditions and hypermobility syndromes. No peripheral synovitis appreciable on exam today. 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 - Ordered blood tests including inflammatory markers and specific antibody tests. - Consider trial of hydroxychloroquine if inflammation suspected.  Celiac disease - Plan: Tissue transglutaminase, IgA, IgA Long-standing celiac disease managed with a gluten-free diet. Symptoms improved with adherence. Concerns about dietary inconsistencies. - Ordered IgA and TTG antibody tests.    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 Propranolol effective. Differential includes POTS and Ehlers-Danlos syndrome. No recent cardiology evaluation. - Continue propranolol. - Provided information on exercise programs for POTS. - Consider referral to Dr. Artist Lloyd for hypermobility with likely associated arthralgias and orthostatic intolerance issues   Orders: Orders Placed This Encounter  Procedures   RNP Antibody   Anti-Smith antibody   Sjogrens syndrome-A extractable nuclear antibody   Anti-DNA antibody, double-stranded   C3 and C4   Sedimentation rate   C-reactive protein   Tissue transglutaminase, IgA   IgA   No orders of the defined types were placed in this encounter.    Follow-Up Instructions: Return in about 4 weeks (around 01/11/2024) for New pt +ANA/?FMS/?mobility f/u 24mo.   Lonni LELON Ester, MD  Note - This record has been created using [employer].  Chart creation errors have been sought, but may not always  have been  located. Such creation errors do not reflect on  the standard of medical care.

## 2023-12-21 LAB — IGA: Immunoglobulin A: 251 mg/dL (ref 47–310)

## 2023-12-21 LAB — ANTI-SMITH ANTIBODY: ENA SM Ab Ser-aCnc: <1 AI

## 2023-12-21 LAB — RNP ANTIBODY: Ribonucleic Protein(ENA) Antibody, IgG: <1 AI

## 2023-12-21 LAB — TISSUE TRANSGLUTAMINASE, IGA: (tTG) Ab, IgA: <1 U/mL

## 2023-12-21 LAB — C3 AND C4
C3 Complement: 163 mg/dL (ref 83–193)
C4 Complement: 30 mg/dL (ref 15–57)

## 2023-12-21 LAB — ANTI-DNA ANTIBODY, DOUBLE-STRANDED: ds DNA Ab: <1 [IU]/mL

## 2023-12-21 LAB — SEDIMENTATION RATE: Sed Rate: 14 mm/h (ref 0–20)

## 2023-12-21 LAB — C-REACTIVE PROTEIN: CRP: 10.4 mg/L — ABNORMAL HIGH (ref <8.0)

## 2023-12-21 LAB — SJOGRENS SYNDROME-A EXTRACTABLE NUCLEAR ANTIBODY: SSA (Ro) (ENA) Antibody, IgG: <1 AI

## 2024-01-18 ENCOUNTER — Encounter: Payer: Self-pay | Admitting: Internal Medicine

## 2024-01-18 ENCOUNTER — Ambulatory Visit: Attending: Internal Medicine | Admitting: Internal Medicine

## 2024-01-18 VITALS — BP 102/74 | HR 83 | Temp 97.5°F | Resp 12 | Ht 64.0 in | Wt 192.0 lb

## 2024-01-18 DIAGNOSIS — M222X1 Patellofemoral disorders, right knee: Secondary | ICD-10-CM | POA: Diagnosis not present

## 2024-01-18 DIAGNOSIS — M222X2 Patellofemoral disorders, left knee: Secondary | ICD-10-CM | POA: Diagnosis not present

## 2024-01-18 DIAGNOSIS — Q796 Ehlers-Danlos syndrome, unspecified: Secondary | ICD-10-CM | POA: Diagnosis not present

## 2024-01-18 NOTE — Progress Notes (Signed)
 "  Office Visit Note  Patient: Kristi Jacobson             Date of Birth: 1985-04-12           MRN: [id number]             PCP: Roy Harlene HERO, NP Referring: Roy Harlene HERO, NP Visit Date: 01/18/2024   Subjective:  Medical Management of Chronic Issues   Discussed the use of AI scribe software for clinical note transcription with the patient, who gave verbal consent to proceed.  History of Present Illness   Kristi Jacobson is a 38 year old female who presents for follow up with positive ANA and joint pain.  She is following up on previous test results related to a positive ANA. Additional antibody tests for conditions such as lupus and Sjogren's syndrome were completed and returned negative. Her CRP test was slightly elevated at 10.4.  She has a history of joint hypermobility, which she believes may be contributing to her joint pain. She experiences joint pain that is exacerbated by tension, particularly at night, leading to discomfort upon waking. Even on nights when she is not tense, she still wakes up with pain.  She is not currently on any specific medication for these symptoms.      Previous HPI 12/14/23 Kristi Jacobson is a 38 year old female with celiac disease who presents with joint pain, swelling, and fatigue. She was referred by her primary care physician due to abnormal lab results, including a positive ANA and blood count abnormalities, along with symptoms of joint pain, swelling, lightheadedness, fatigue, and skin changes.   She has been experiencing joint pain, swelling, lightheadedness, fatigue, and skin changes since November of last year, with symptoms peaking in severity until April of this year. During this period, she was under significant work-related stress, which she believes may have exacerbated her symptoms. She has adhered to a gluten-free diet for 14 years, which has significantly improved her celiac disease symptoms.   She experiences bouts of  presyncope characterized by a sudden spike in heart rate and palpitations. Compression socks and propranolol have been effective in managing these symptoms. She has a history of visiting cardiologists due to these symptoms, often resulting in ER visits due to concerns of a heart attack, but tests have consistently shown normal results. Dizziness occurs when standing for long periods or when exposed to heat, and her legs become red and splotchy, which is alleviated by compression socks.   She describes experiencing numbness and tingling in her extremities, which occurs sporadically without a clear trigger. She also reports a history of anxiety and panic attacks, which have been lifelong issues. Propranolol has been beneficial in managing her anxiety symptoms.   She notes skin changes, including random bruising on her legs and arms without a clear cause. She has not experienced abnormal bleeding. She has been diligent about masking and working from home to avoid illness, and has not been seriously ill in the past year. However, she has a history of frequent illnesses prior to adopting these precautions.   She reports significant bloating after eating, regardless of the type of food consumed. She is not aware of any formal diagnoses of autoimmune conditions among her relatives, though she notes that some family members have had symptoms that could be suspicious for such conditions.   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 This episode was  severe, spreading to her abdomen and causing joint immobility.    Review of Systems  Constitutional:  Positive for fatigue.  HENT:  Positive for mouth dryness. Negative for mouth sores.   Eyes:  Positive for dryness.  Respiratory:  Negative for shortness of breath.   Cardiovascular:  Positive for palpitations. Negative for chest pain.  Gastrointestinal:  Positive for constipation and  diarrhea. Negative for blood in stool.  Endocrine: Positive for increased urination.  Genitourinary:  Positive for sexual difficulties. Negative for involuntary urination.  Musculoskeletal:  Positive for joint pain, joint pain, myalgias, muscle weakness, morning stiffness, muscle tenderness and myalgias. Negative for gait problem and joint swelling.  Skin:  Positive for color change and hair loss. Negative for rash and sensitivity to sunlight.  Allergic/Immunologic: Positive for susceptible to infections.  Neurological:  Positive for dizziness and headaches.  Hematological:  Negative for swollen glands.  Psychiatric/Behavioral:  Positive for depressed mood and sleep disturbance. The patient is nervous/anxious.     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   Anxiety 12/14/2023   Encounter for screening for other metabolic disorders 12/14/2023   Positive ANA (antinuclear antibody) 12/14/2023   Patellofemoral pain syndrome 12/14/2023   PCOS (polycystic ovarian syndrome) 12/28/2020   Family history of colon cancer 12/09/2020   History of esophagogastroduodenoscopy (EGD) 12/09/2020   Gastroenteritis 01/06/2017   T2DM (type 2 diabetes mellitus) (HCC) 01/06/2017   Asthma    Menorrhagia    Celiac disease     Past Medical History:  Diagnosis Date   Asthma    Autism disorder 03/2023   Celiac disease    Diabetes mellitus without complication (HCC)    Menorrhagia    PCOS (polycystic ovarian syndrome) 2002    Family History  Problem Relation Age of Onset   Diabetes Brother 30   Congestive Heart Failure Brother    Breast cancer Paternal Grandmother    Skin cancer Mother    Skin cancer Father    Breast cancer Maternal Grandmother    Hypertension Maternal Grandfather    Past Surgical History:  Procedure  Laterality Date   WISDOM TOOTH EXTRACTION     Social History   Social History Narrative   Lives in [place]. Lives alone.    There is no immunization history on file for this patient.   Objective: Vital Signs: BP 102/74   Pulse 83   Temp (!) 97.5 F (36.4 C)   Resp 12   Ht 5' 4 (1.626 m)   Wt 192 lb (87.1 kg)   LMP 01/11/2024   BMI 32.96 kg/m    Physical Exam Eyes:     Conjunctiva/sclera: Conjunctivae normal.  Cardiovascular:     Rate and Rhythm: Normal rate and regular rhythm.  Pulmonary:     Effort: Pulmonary effort is normal.     Breath sounds: Normal breath sounds.  Lymphadenopathy:     Cervical: No cervical adenopathy.  Skin:    General: Skin is warm and dry.     Comments: Central facial erythema with telangiectasias  Neurological:     Mental Status: She is alert.  Psychiatric:        Mood and Affect: Mood normal.      Musculoskeletal Exam:  Neck full ROM no tenderness Shoulders full ROM no tenderness or swelling Elbows full ROM no tenderness or swelling Wrists  full ROM no tenderness or swelling Fingers full ROM no tenderness or swelling No paraspinal tenderness to palpation over upper and lower back Hip internal rotation limited with soft endpoint, no focal tenderness to pressure Knees full ROM no tenderness or swelling, patellofemoral crepitus Ankles full ROM no tenderness or swelling  Investigation: No additional findings.  Imaging: No results found.  Recent Labs: Lab Results  Component Value Date   WBC 14.0 (H) 02/23/2023   HGB 13.0 02/23/2023   PLT 324 02/23/2023   NA 140 02/23/2023   K 4.0 02/23/2023   CL 105 02/23/2023   CO2 25 02/23/2023   GLUCOSE 93 02/23/2023   BUN 11 02/23/2023   CREATININE 0.71 02/23/2023   BILITOT 0.4 01/18/2017   ALKPHOS 61 01/18/2017   AST 22 01/18/2017   ALT 27 01/18/2017   PROT 7.8 01/18/2017   ALBUMIN 4.1 01/18/2017   CALCIUM 9.9 02/23/2023   GFRAA >60 01/03/2018    Speciality Comments: No  specialty comments available.  Procedures:  No procedures performed Allergies: Cephalosporins, Penicillins, Azithromycin , Gluten meal, Nyquil hbp cold & flu [dm-doxylamine-acetaminophen ], Other, and Wheat   Assessment / Plan:     Visit Diagnoses: Patellofemoral pain syndrome of both knees - Plan: AMB referral to sports medicine Ehlers-Danlos Syndrome Suspect contributions of joint hypermobility causing pain. Prefers non-pharmacological management with borderline labs and no objective signs of peripheral synovitis or otherwise. 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 Propranolol effective. Differential includes POTS and Ehlers-Danlos syndrome. - Symptomatic management for localized pain areas - Referred to sports medicine specialist for evaluation and management. - Consider physical therapy needing graduated exercise intensity  Mildly elevated C-reactive protein (CRP), not otherwise explained CRP at 10.4, non-specific. Negative for lupus and Sjogren's. Differential includes metabolic diseases, inflammatory bowel diseases, and rheumatoid arthritis. Prefers non-pharmacological approach initially. - Monitor CRP levels based on symptom changes would schedule PRN no specific interval. - Consider hydroxychloroquine trial if symptoms persist with abnormal labs and other causes are ruled out but nonpharmacologic treatment plan first.     Orders: Orders Placed This Encounter  Procedures   AMB referral to sports medicine   No orders of the defined types were placed in this encounter.   Follow-Up Instructions: Return if symptoms worsen or fail to improve.   Lonni LELON Ester, MD  Note - This record has been created using [employer].  Chart creation errors have been sought, but may not always  have been located. Such creation errors do not reflect on  the standard of medical care. "

## 2024-01-18 NOTE — Patient Instructions (Signed)
 I recommend checking out the [employer]  patient-centered guide for fibromyalgia and chronic pain management: https://howell-gardner.net/  Consider adding exercises designed for POTS to improve strength while addressing vascular dysregulation from the standinguptopots.org website:  Http://peterson-powell.net/

## 2024-01-20 ENCOUNTER — Telehealth: Payer: Self-pay | Admitting: *Deleted

## 2024-01-20 NOTE — Telephone Encounter (Signed)
 Patient would like to discuss her lab results for Celiac disease and possibly being retested.

## 2024-01-23 NOTE — Telephone Encounter (Signed)
 Contacted patient to advise that per Dr Jeannetta, Based on her records she previously had very bad intolerance of gluten so he wouldn't particularly recommend going back to eating it. The test would not reliably rule it out if she was not eating anything for the past 3-6 months since the antibodies would go away. Some people also have sensitivity or intolerance without formal inflammatory disease.  Patient verbalized understanding.

## 2024-01-23 NOTE — Telephone Encounter (Signed)
 Her TTG test was normal and total IgA level was normal. These are the most common standard tests for celiac disease. I usually wouldn';t repeat it after 1 month due to low chance of finding anything new. False negatives do occur people who have not eaten any gluten product in months but I do not routinely recommend trying to set off symptoms just to test.

## 2024-01-23 NOTE — Telephone Encounter (Signed)
 Based on her records she previously had very bad intolerance of gluten so I wouldn't particularly recommend going back to eating it. The test would not reliably rule it out if she was not eating anything for the past 3-6 months since the antibodies would go away. Some people also have sensitivity or intolerance without formal inflammatory disease.

## 2024-01-23 NOTE — Telephone Encounter (Signed)
 Patient advised Her TTG test was normal and total IgA level was normal. These are the most common standard tests for celiac disease. I usually wouldn';t repeat it after 1 month due to low chance of finding anything new. False negatives do occur people who have not eaten any gluten product in months but I do not routinely recommend trying to set off symptoms just to test.  Patient would like to know with those tests being normal is she able to eat gluten? Patient states she has had a colonoscopy and endoscopy that did not show signs of Celiac about 3 years ago. Please advise.

## 2024-01-25 NOTE — Progress Notes (Unsigned)
 LILLETTE Ileana Collet, PhD, LAT, ATC acting as a scribe for Artist Lloyd, MD.  Kristi Jacobson is a 38 y.o. 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 She was referred by rheum.  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  She was prescribed propranolol by her primary care provider and this has been phenomenally helpful.  She is drinking and consuming a little bit extra salt typically 2 drip drop salt packets a day (330mg  sodium each).  She is also eating the typical American amount of salt in her diet.  She is trying to drink 90 ounces of fluid a day and using compression.  All this has helped.  MS: Chronic joint pain, Chronic wide spread muscle pain, Joint Hypermobility, Joint Instability, and Slipping Ribs / Costochondritis  Skin/Immune reactions: Hyperextensible/ stretchy Skin, Fragile Skin that tears easily, Soft Velvety Skin, Poor Wound Healing, Easy Bruising / Bleeding, Atrophic Scars, Abnormal Stretch Marks Before Puberty / without significant weight gain , and Medication / Chemical / Food Sensitivities , gluten intolerance,  Neurological: Headache  Migraine, Syncope / Pre-Syncope, Reduced Proprioception, Clumsiness , Burning, Numbness and/or Tingling in Extremities, CCI, and Cognitive Dysfunction / Brain Fog ANS: Dysautonomia / POTS / Orthostatic Intolerance, Syncope / Pre-Syncope / Dizziness, Raynaud's , and Anxiety / Panic Respiratory: Dyspnea and Chest Tightness CV: Tachycardia, palpitations,  GI:  Frequent N/V Genitourinary: Recurrent UTI / Cystitis Hands & Feet: Long Slender Fingers / Toes, Piezogenic Papules, Flat Feet , and Raynaud's Phenomenon   Treatments tried: gluten free diet, betablocker, compression socks  Pertinent review of systems: No fevers or chills  Relevant historical information: Diabetes   Exam:  BP 120/78   Pulse 78   Ht 5' 4 (1.626 m)   Wt 190 lb (86.2 kg)   LMP 01/11/2024   SpO2 99%    BMI 32.61 kg/m  General: Well Developed, well nourished, and in no acute distress.   MSK: Hypermobile evaluation is equivocal with a score of 3-4 with a positive modifier of history of more hypermobility. Ehlers-Danlos evaluation is positive with a score of 5. Family history is suspected but not formally diagnosed with EDS.    Lab and Radiology Results No results found for this or any previous visit (from the past 72 hours). No results found.     Assessment and Plan: 38 y.o. 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  Additionally she does and will meet criteria for fibromyalgia.  We discussed treatment options.  Primary treatment is physical therapy.  Referral placed today.  Additionally for her overall pain we will try nortriptyline at bedtime.  This is in the fibromyalgia category and may be helpful.  If not we can continue down that pathway with other similar medicines.  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  We discussed options and will use Zyrtec  and Pepcid both twice daily.  Lastly dysautonomia/POTS is highly suspected.  Her primary care provider has done an excellent job with propranolol salt fluids and compression.  Will go ahead and extend cardiac workup to include echocardiogram and long-term monitor.  Recommend Cavilon skin prep before the monitor to reduce skin irritation from adhesives comment and mast cell activation syndrome.   PDMP not reviewed this encounter. Orders Placed This Encounter  Procedures   Ambulatory referral to Physical Therapy    Referral  Priority:   Routine    Referral Type:   Physical Medicine    Referral Reason:   Specialty Services Required    Requested Specialty:   Physical Therapy    Number of Visits Requested:   1   Meds ordered this  encounter  Medications   nortriptyline (PAMELOR) 25 MG capsule    Sig: Take 1 capsule (25 mg total) by mouth at bedtime.    Dispense:  60 capsule    Refill:  2   cetirizine  (ZYRTEC ) 10 MG tablet    Sig: Take 1 tablet (10 mg total) by mouth 2 (two) times daily.    Dispense:  60 tablet    Refill:  3   famotidine (PEPCID) 20 MG tablet    Sig: Take 1 tablet (20 mg total) by mouth 2 (two) times daily.    Dispense:  90 tablet    Refill:  2     Discussed warning signs or symptoms. Please see discharge instructions. Patient expresses understanding.   The above documentation has been reviewed and is accurate and complete Artist Lloyd, M.D.

## 2024-01-26 ENCOUNTER — Ambulatory Visit: Admitting: Family Medicine

## 2024-01-26 ENCOUNTER — Ambulatory Visit: Attending: Family Medicine

## 2024-01-26 VITALS — BP 120/78 | HR 78 | Ht 64.0 in | Wt 190.0 lb

## 2024-01-26 DIAGNOSIS — D8940 Mast cell activation, unspecified: Secondary | ICD-10-CM | POA: Diagnosis not present

## 2024-01-26 DIAGNOSIS — Q796 Ehlers-Danlos syndrome, unspecified: Secondary | ICD-10-CM | POA: Diagnosis not present

## 2024-01-26 DIAGNOSIS — I951 Orthostatic hypotension: Secondary | ICD-10-CM | POA: Insufficient documentation

## 2024-01-26 DIAGNOSIS — M2550 Pain in unspecified joint: Secondary | ICD-10-CM

## 2024-01-26 DIAGNOSIS — R002 Palpitations: Secondary | ICD-10-CM

## 2024-01-26 DIAGNOSIS — M797 Fibromyalgia: Secondary | ICD-10-CM | POA: Diagnosis not present

## 2024-01-26 MED ORDER — NORTRIPTYLINE HCL 25 MG PO CAPS: 25.0000 mg | ORAL_CAPSULE | Freq: Every day | ORAL | 2 refills | Status: DC

## 2024-01-26 NOTE — Progress Notes (Unsigned)
 EP to read.

## 2024-01-26 NOTE — Patient Instructions (Addendum)
 Thank you for coming in today.   Cavilon skin prep  I've referred you to Physical Therapy.  Let us  know if you don't hear from them in one week.   I've sent a prescription for Zyrtec , Pepcid, & nortriptyline to your pharmacy.   Check back in 2 months  Check out the book Disjointed Navigating the Diagnosis and Management of Hypermobile Ehlers-Danlos Syndrome and Hypermobility Spectrum Disorders.     For POTS symptoms: Consume 3 L water and 8-12 gram of salt per day    Check out these websites for exercises to try if you have POTS (CHOP Protocol, [place] Protocol, and [employer] Protocol) Https://www.taylor-robbins.com/ https://dean.info/    Guide to Prof. Consuelo Patient Self-Care Handouts https://webspace.Http://www.black-smith.org/   Orthostatic Intolerance and Postural Orthostatic Tachycardia Self-Care Checklist https://webspace.Wvlyxc.com   Steps To Managing Your Mast Cell Activation Syndrome https://[email or web address].Newyearsms.fi.pdf

## 2024-02-19 DIAGNOSIS — I951 Orthostatic hypotension: Secondary | ICD-10-CM

## 2024-02-19 DIAGNOSIS — D8940 Mast cell activation, unspecified: Secondary | ICD-10-CM

## 2024-02-19 DIAGNOSIS — R002 Palpitations: Secondary | ICD-10-CM | POA: Diagnosis not present

## 2024-02-20 ENCOUNTER — Ambulatory Visit: Payer: Self-pay | Admitting: Family Medicine

## 2024-02-20 NOTE — Progress Notes (Signed)
 Heart monitor looked okay.  No signs of dangerous rhythms.

## 2024-02-21 ENCOUNTER — Other Ambulatory Visit: Payer: Self-pay | Admitting: Family Medicine

## 2024-02-21 NOTE — Telephone Encounter (Signed)
 90 day supply approved

## 2024-03-06 ENCOUNTER — Ambulatory Visit (HOSPITAL_COMMUNITY)

## 2024-03-27 ENCOUNTER — Ambulatory Visit: Admitting: Family Medicine

## 2024-04-10 ENCOUNTER — Ambulatory Visit (HOSPITAL_COMMUNITY)
